# Patient Record
Sex: Female | Born: 1970 | Race: White | Hispanic: No | Marital: Married
Health system: Southern US, Community
[De-identification: ages and names within clinical notes are randomized; demographics above are authoritative.]

## PROBLEM LIST (undated history)

## (undated) DIAGNOSIS — M1990 Unspecified osteoarthritis, unspecified site: Secondary | ICD-10-CM

## (undated) DIAGNOSIS — Z87442 Personal history of urinary calculi: Secondary | ICD-10-CM

## (undated) DIAGNOSIS — E785 Hyperlipidemia, unspecified: Secondary | ICD-10-CM

## (undated) DIAGNOSIS — G35 Multiple sclerosis: Secondary | ICD-10-CM

## (undated) DIAGNOSIS — I639 Cerebral infarction, unspecified: Secondary | ICD-10-CM

## (undated) DIAGNOSIS — K219 Gastro-esophageal reflux disease without esophagitis: Secondary | ICD-10-CM

## (undated) DIAGNOSIS — G459 Transient cerebral ischemic attack, unspecified: Secondary | ICD-10-CM

## (undated) DIAGNOSIS — F419 Anxiety disorder, unspecified: Secondary | ICD-10-CM

## (undated) DIAGNOSIS — E7800 Pure hypercholesterolemia, unspecified: Secondary | ICD-10-CM

## (undated) DIAGNOSIS — F32A Depression, unspecified: Secondary | ICD-10-CM

## (undated) DIAGNOSIS — G43909 Migraine, unspecified, not intractable, without status migrainosus: Secondary | ICD-10-CM

## (undated) DIAGNOSIS — E119 Type 2 diabetes mellitus without complications: Secondary | ICD-10-CM

## (undated) HISTORY — PX: WISDOM TOOTH EXTRACTION: SHX21

## (undated) HISTORY — PX: UPPER GI ENDOSCOPY: SHX6162

## (undated) HISTORY — DX: Transient cerebral ischemic attack, unspecified: G45.9

## (undated) HISTORY — DX: Multiple sclerosis: G35

## (undated) HISTORY — DX: Anxiety disorder, unspecified: F41.9

## (undated) HISTORY — DX: Hyperlipidemia, unspecified: E78.5

## (undated) HISTORY — DX: Type 2 diabetes mellitus without complications: E11.9

## (undated) HISTORY — DX: Cerebral infarction, unspecified: I63.9

## (undated) HISTORY — DX: Migraine, unspecified, not intractable, without status migrainosus: G43.909

---

## 1998-01-01 ENCOUNTER — Inpatient Hospital Stay (HOSPITAL_COMMUNITY): Admission: AD | Admit: 1998-01-01 | Discharge: 1998-01-04 | Payer: Self-pay | Admitting: Obstetrics

## 1998-03-10 ENCOUNTER — Encounter: Admission: RE | Admit: 1998-03-10 | Discharge: 1998-06-08 | Payer: Self-pay | Admitting: Obstetrics

## 1998-10-17 HISTORY — PX: OTHER SURGICAL HISTORY: SHX169

## 2007-10-18 HISTORY — PX: ABDOMINAL HYSTERECTOMY: SHX81

## 2007-11-14 HISTORY — PX: COLONOSCOPY: SHX174

## 2008-01-31 ENCOUNTER — Ambulatory Visit (HOSPITAL_COMMUNITY): Admission: RE | Admit: 2008-01-31 | Discharge: 2008-01-31 | Payer: Self-pay | Admitting: Obstetrics and Gynecology

## 2008-01-31 ENCOUNTER — Encounter (INDEPENDENT_AMBULATORY_CARE_PROVIDER_SITE_OTHER): Payer: Self-pay | Admitting: Obstetrics and Gynecology

## 2008-04-08 ENCOUNTER — Encounter (INDEPENDENT_AMBULATORY_CARE_PROVIDER_SITE_OTHER): Payer: Self-pay | Admitting: Obstetrics and Gynecology

## 2008-04-08 ENCOUNTER — Inpatient Hospital Stay (HOSPITAL_COMMUNITY): Admission: RE | Admit: 2008-04-08 | Discharge: 2008-04-10 | Payer: Self-pay | Admitting: Obstetrics and Gynecology

## 2011-03-01 NOTE — H&P (Signed)
Joanne Reed, FREDMAN NO.:  [id number]   MEDICAL RECORD NO.:  [id number]          PATIENT TYPE:  AMB   LOCATION:  SDC                           FACILITY:  [facility]   PHYSICIAN:  Randye Lobo, M.D.   DATE OF BIRTH:  Mar 27, 1971   DATE OF ADMISSION:  DATE OF DISCHARGE:                              HISTORY & PHYSICAL   CHIEF COMPLAINT:  Heavy and irregular menses.   HISTORY OF PRESENT ILLNESS:  The patient is a 40 year old, gravida 4,  para 3-0-1-3, Caucasian female, status post bilateral tubal ligation and  status post hysteroscopy D&C on January 31, 2008 for menometrorrhagia, who  presents desiring definitive surgical treatment.  The patient has been  previously seen and evaluated for irregular vaginal bleeding and  dysmenorrhea, and she has undergone a normal pelvic ultrasound in the  office on December 13, 2007.  The patient at that time had an office  endometrial biopsy which documented benign secretory endometrium with  some degenerative changes.  The patient at that time desired an  endometrial ablation; she was therefore brought to the operating room on  January 31, 2008 at which time she had a hysteroscopy D&C and the  inability to perform an endometrial ablation due to cavity size and  shape.  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  The patient had been previously also told that she has some cervical  stenosis.  The endometrial ablation was not performed at that time due  to the above.  The patient's pathology report documented benign  endocervical and squamous mucosa.  There were minute glandular fragments  which were thought to be a possible endometrial origin.  The patient now  presents for definitive surgical evaluation and treatment of the  dysmenorrhea and the menometrorrhagia, and the plan is made to proceed  with a total abdominal hysterectomy after risks, benefits, and  alternatives to surgery are rereviewed.   The past  obstetric and gynecologic histories are significant for 3 prior  cesarean sections.  The patient is status post bilateral tubal ligation.  The patient is status post hysteroscopy D&C.  The patient's last Pap  smear was performed November 29, 2007 and was within normal limits.  The  patient does have a history of a benign left breast mass which she is  followed by her primary physician in [place], [place].   PAST MEDICAL HISTORY:  1. Diabetes mellitus.  2. Hyperlipidemia.  3. Anxiety.  4. Iron deficiency anemia.  5. Over weight.   The patient has been off of Adipex in preparation for her current  surgery.   PAST SURGICAL HISTORY:  1. Status post cesarean section x3.  2. Status post bilateral tubal ligation.  3. Status post hysteroscopy D&C.  4. Status post cholecystectomy.   MEDICATIONS:  1. Metformin HCl 500 mg p.o. daily.  2. Iron sulfate 325 mg p.o. daily.  3. Clonazepam 0.5 mg p.o. daily.  4. Sandostatin 20 mg p.o. daily.  5. Citalopram HBR 20 mg p.o. daily   ALLERGIES:  No known drug allergies.  SOCIAL HISTORY:  The patient has been married for 10 years.  She is a  Geophysicist/field seismologist.  She denies the use of tobacco, alcohol or illicit  drugs.   FAMILY HISTORY:  Is positive for aunts with ovarian cancer.  The  patient's mother has kidney and skin cancer.  Her father had throat and  lung cancer.   REVIEW OF SYSTEMS:  At the time of the patient's preoperative visit, she  is reporting some sore throat the day prior to her preoperative exam.  She denied any fever or cough.   PHYSICAL EXAMINATION:  Height is 5 feet 4 inches, weight 179 pounds,  blood pressure 108/68.  In general, the patient is a Caucasian white  female in no acute distress.  HEENT:  Normocephalic, atraumatic.  LUNGS:  Clear to auscultation bilaterally.  HEART:  S1/S2 with a regular rate and rhythm.  ABDOMEN:  There is a well-healed Pfannenstiel incision without evidence  of herniation.   There is no evidence of hepatosplenomegaly or  organomegaly.  PELVIC EXAMINATION:  Documents normal external genitalia and normal  urethra.  The cervix demonstrates no lesions by palpation.  The uterus  is small and anteverted.  No adnexal masses are appreciated.  Exam of the lower extremities displays discoloration of the legs which  is felt to be consistent with brawny edema.   IMPRESSION:  The patient is a 40 year old para 3 female, status post  bilateral tubal ligation and also status post hysteroscopy D&C for  menometrorrhagia, who presents now for definitive surgical treatment.  A  plan is made to proceed with a total abdominal hysterectomy.  Risks,  benefits, and alternatives have been discussed with the patient who  wishes to proceed.      Randye Lobo, M.D.  Electronically Signed     BES/MEDQ  D:  04/07/2008  T:  04/07/2008  Job:  [id number]

## 2011-03-01 NOTE — Op Note (Signed)
Joanne Reed, REITAN NO.:  [id number]   MEDICAL RECORD NO.:  [id number]          PATIENT TYPE:  AMB   LOCATION:  SDC                           [facility]   PHYSICIAN:  Randye Lobo, M.D.   DATE OF BIRTH:  [birth year]   DATE OF PROCEDURE:  01/31/2008  DATE OF DISCHARGE:                               OPERATIVE REPORT   PREOPERATIVE DIAGNOSIS:  Menometrorrhagia.   POSTOPERATIVE DIAGNOSIS:  Menometrorrhagia.   PROCEDURE:  Hysteroscopy, dilatation and curettage.   SURGEON:  Conley Simmonds, MD   ANESTHESIA:  General endotracheal, paracervical block.   IV FLUIDS:  1300 mL Ringer's lactate.   ESTIMATED BLOOD LOSS:  Minimal.   URINE OUTPUT:  150 mL   COMPLICATIONS:  None.   SPECIMENS:  Endometrial curettings were sent to pathology.   SORBITOL DEFICIT:  50 mL.   INDICATIONS FOR PROCEDURE:  The patient is a 40 year old gravida 4, para  18, 0-1-3 Caucasian female, status post bilateral tubal ligation, who  presented with menstruation two times per month occurring every other  month.  The patient was experiencing prolonged menses of up to 9 days of  duration.  She required pad changes every 2 hours with clotting.  The  patient has been taking iron due to anemia.  An ultrasound December 13, 2007 demonstrated a normal uterus with an endometrial stripe of 0.52 cm.  The ovaries were unremarkable.  An endometrial biopsy on December 13, 2007 documented secretory endometrium.  A plan is now made to proceed  with a hysteroscopy, D&C and endometrial ablation after risks, benefits,  and alternatives were reviewed.   FINDINGS:  Examination under anesthesia revealed a small anteverted  mobile uterus.  No adnexal masses were appreciated.   The D&C and hysteroscopy demonstrated a small uterine cavity measuring  5.5 cm in maximal length.  There appeared to be more development of the  right uterine horn than of the left uterine horn raising the question of  a possible  unicornuate uterus.  There was a small amount of thickening  of the endometrium.   DESCRIPTION OF PROCEDURE:  The patient was reidentified in the  preoperative hold area.  She was taken to the operating room where  general endotracheal anesthesia was induced.  She was placed in the  dorsal lithotomy position and the lower abdomen and vagina and perineum  were sterilely prepped.  The patient was catheterized of urine.  She was  then sterilely draped.   An exam under anesthesia was performed.   A speculum was placed inside the vagina and a single-tooth tenaculum was  placed on the anterior cervical lip.  A paracervical block was performed  with a total of 10 mL in standard fashion.  The length to the internal  os was measured at approximately 4 cm.  The uterus was then sounded to a  length of approximately 5.5 centimeters.  The cervical os was dilated  slightly with Pratt dilators to allow of the diagnostic hysteroscope.  The hysteroscope was inserted under the continuous infusion of sorbitol.  The  findings are as noted above.  Due to the small uterine cavity length  and concern for possible mullerian anomaly, the endometrial ablation was  not performed.  The cervix was then further dilated to a number 21 Pratt  dilator and a serrated curette was introduced into the uterine cavity  and endometrial curettings were obtained in all four quadrants.  The  specimen was sent to pathology.  The vaginal instruments were then  removed.  Hemostasis was good.   The patient's procedure was concluded.  There were no complications.  All needle, instrument, sponge counts were correct.  The patient is  escorted to the recovery room in stable and awake condition.      Randye Lobo, M.D.  Electronically Signed     BES/MEDQ  D:  01/31/2008  T:  01/31/2008  Job:  [id number]

## 2011-03-01 NOTE — Op Note (Signed)
NAMEJULLIANA, Joanne Reed NO.:  [id number]   MEDICAL RECORD NO.:  [id number]          PATIENT TYPE:  INP   LOCATION:  9309                          [facility]   PHYSICIAN:  Randye Lobo, M.D.   DATE OF BIRTH:  [birth year]   DATE OF PROCEDURE:  04/08/2008  DATE OF DISCHARGE:                               OPERATIVE REPORT   PREOPERATIVE DIAGNOSIS:  Menometrorrhagia.   POSTOPERATIVE DIAGNOSIS:  Menometrorrhagia.   PROCEDURE:  Total abdominal hysterectomy.   SURGEON:  Randye Lobo, MD.   ASSISTANTLuvenia Redden, MD.   ANESTHESIA:  General endotracheal.   IV FLUIDS:  2000 mL Ringer's lactate.   ESTIMATED BLOOD LOSS:  100 mL   URINE OUTPUT:  400 mL   COMPLICATIONS:  None.   INDICATIONS FOR THE PROCEDURE:  The patient is a 40 year old gravida 4,  para 3-0-1-3 Caucasian female, status post bilateral tubal ligation and  status post hysteroscopy D&C was performed on January 31, 2008, for  menometrorrhagia, who desires definitive surgical treatment.  The  patient is previously had a normal pelvic ultrasound.  She was taken to  the operating room on January 31, 2008, with the intention of doing a  NovaSure endometrial ablation.  However at the time, the uterine cavity  appeared to be extremely small and the endometrial ablation could,  therefore, not be performed.  The patient does have an obstetric history  significant for 3 prior cesarean sections.  A plan is now made to  proceed with a total abdominal hysterectomy after risks, benefits, and  alternatives are once again reviewed.   FINDINGS:  The uterus and ovaries appeared to be normal.  The fallopian  tubes were consistent with a prior bilateral tubal ligation.  The  patient had a very elongated cervix which measured at least 8 cm in  length.  There was no evidence of any endometriosis nor adhesive disease  in the pelvis.  There were some filmy adhesions which were present in  the right upper quadrant  consistent with the patient's prior  cholecystectomy.   SPECIMENS:  The uterus and cervix were sent to pathology.   PROCEDURE:  The patient was reidentified in the preoperative hold area.  She did receive Ancef 1 g IV for antibiotic prophylaxis.  The patient  received both TED hose and PAS stockings for DVT prophylaxis.  In the  operating room, general endotracheal anesthesia was induced.  The  patient's abdomen and vagina were sterilely prepped, and a Foley  catheter was placed inside the bladder.  She was sterilely draped.   A Pfannenstiel incision was created sharply with a scalpel along the  line of the patient's previous Pfannenstiel incision.  The incision was  carried down to the fascia using the scalpel.  The hemostasis in the  subcutaneous layer was created with monopolar cautery.  The fascia was  incised with the scalpel in the midline and the incision was extended  with the Mayo scissors bilaterally.  The rectus muscles were dissected  off of the fascia superiorly and inferiorly.  The rectus muscles were  sharply divided in the midline.  The parietal peritoneum was elevated  with 2 hemostat clamps and entered sharply with the Metzenbaum scissors.  The peritoneal incision was extended cranially and caudally using sharp  dissection.   A self-retaining retractor was placed inside the pelvis and lap pads  were used to pack the bowel into the upper abdomen.   Long Kelly clamps were placed across the adnexal structures bilaterally.  The right round ligament was grasped with the pickups and suture ligated  with a transfixing suture of 0 Vicryl.  The round ligament was then  cauterized and bisected with monopolar cautery.  The dissection was  carried through the anterior and posterior leaves of the broad ligament  using the same instrument.  A window was then created through the  posterior leaf of the broad ligament and the remnant of the proximal  fallopian tube and  utero-ovarian ligament were then clamped, sharply  divided, and suture ligated with a transfixing suture of 0 Vicryl,  followed by then a suture ligature of the same.  Hemostasis was good.  The bladder was taken down off of the anterior lower uterine segment  with a combination of sharp dissection and monopolar cautery.  The same  procedure that was performed on the right-hand side was then repeated on  the left-hand side.  There was some bleeding noted in the broad ligament  on the patient's left-hand side and this was cauterized by elevating  with a smooth pickups and then using monopolar cautery after the ureter  was noted to be out of the way of the surgical field.   Each of the uterine arteries were then clamped, sharply lysed, sharply  divided, and ligated with the suture of 0 Vicryl.  The bladder was  further dissected off of the cervix using monopolar cautery.  This was  performed without difficulty.  The remaining branches of the cardinal  ligaments were then clamped with straight Heaney clamps, sharply divided  with a scalpel, and suture ligated with 0 Vicryl bilaterally.  The  cervix was noted to be very long.  Each of the uterosacral ligaments  were clamped, sharply divided, and suture ligated with transfixing  sutures of 0 Vicryl.  This brought the dissection all the way down to  the level of the bottom of the cervix.  Curved Heaney clamps were then  placed across the top of the vagina bilaterally.  The specimen was then  sharply excised and sent to pathology.  Transfixing sutures of 0 Vicryl  were placed at the angles bilaterally and a single figure-of-eight  suture was placed in the midportion of the vaginal apex for its closure.   The pelvis was then irrigated and suctioned and examined and found to be  hemostatic.   The self-retaining retractor and the lap pads were removed from the  peritoneal cavity.   The abdomen was closed.  The parietal peritoneum was closed with  a  running suture of 2-0 Vicryl.  The fascia was closed with a running  suture of 0 Vicryl.  The subcutaneous layer was irrigated and suctioned  and made hemostatic with monopolar cautery.  The subcutaneous tissue was  undermined on the left inferior portion of the incision due to scarring  from her previous procedures.  Hemostasis was good.  The skin was,  therefore, closed with staples and a sterile bandage was placed of the  incision.   This concluded the patient's procedure.  There were  no complications.  All needle, instrument, and sponge counts were correct.  The patient is  escorted to the recovery room in stable and awake condition.      Randye Lobo, M.D.  Electronically Signed     BES/MEDQ  D:  04/08/2008  T:  04/08/2008  Job:  [id number]

## 2011-03-04 NOTE — Discharge Summary (Signed)
NAMEVALERIE, CONES NO.:  [id number]   MEDICAL RECORD NO.:  [id number]          PATIENT TYPE:  INP   LOCATION:  9309                          [facility]ILITY[facility]   PHYSICIAN:  Randye Lobo, M.D.   DATE OF BIRTH:  1971/07/16   DATE OF ADMISSION:  04/08/2008  DATE OF DISCHARGE:  04/10/2008                               DISCHARGE SUMMARY   ADMISSION DIAGNOSES:  1. Menometrorrhagia.  2. Adult-onset diabetes mellitus.   DISCHARGE DIAGNOSES:  1. Menometrorrhagia.  2. Adult-onset diabetes mellitus.  3. Status post total abdominal hysterectomy.   SIGNIFICANT OPERATIONS AND PROCEDURES:  The patient underwent a total  abdominal hysterectomy under the direction of Dr. Conley Simmonds and with  the assistance of Dr. Lodema Hong at the [facility] [place]  on April 08, 2008.   ADMISSION HISTORY AND PHYSICAL EXAMINATION:  The patient is a 40-year-  old, gravida 4, para 3-0-1-3, Caucasian female, status post bilateral  tubal ligation and status post hysteroscopy and D&C on January 31, 2008,  for menometrorrhagia, who desired a definitive surgical treatment.  The  patient in the office has previously had a normal pelvic ultrasound and  a benign endometrial biopsy.  An attempt was made to perform a NovaSure  endometrial ablation at the time of the patient's hysteroscopy and D&C  on January 31, 2008, however, this was not possible due to the unusual  cavity shape and size noted at that time.  The patient also was told she  had a history of some cervical stenosis.  The pathology report from the  [facility] demonstrated benign endocervical and squamous mucosa along with  minute fragments of what was thought to be endometrial cells.  The  patient does have a history of 3 prior cesarean sections.  She also has  a history of adult-onset diabetes mellitus, which was controlled with  metformin.  Please refer to her complete history and physical for  further medical history.   PHYSICAL  EXAMINATION:  The patient was noted to have a well-healed  Pfannenstiel incision without evidence of herniation.  There was no  evidence of hepatosplenomegaly or organomegaly.  Her pelvic exam  documented normal external genitalia and a normal urethra.  The cervix  demonstrated no lesions.  The uterus was noted to be small and  anteverted.  No adnexal masses were noted.   Exam of the lower extremities displayed brownish discoloration of the  anterior tibial surfaces consistent with brawny edema.   HOSPITAL COURSE:  The patient was admitted on April 08, 2008, at which  time she underwent a total abdominal hysterectomy performed under  general anesthesia.  The findings at surgery documented a normal-  appearing uterus and ovaries.  The fallopian tubes were consistent with  a prior tubal ligation and the patient was noted to have a very  elongated cervix measuring approximately 8 cm in length visually, which  was thought to explain the previous findings of a small uterine cavity  and difficulty with the NovaSure procedure.  There was no evidence of  endometriosis nor adhesive disease  in the abdomen or pelvis.  There was  some filmy adhesions present in the right upper quadrant consistent with  the patient's prior cholecystectomy.  The estimated blood loss from  surgery was 100 mL and there were no complications.   During the patient's postoperative course, she was placed on an insulin  drip to maintain adequate glucose control while she was not yet started  on a normal diet.  Her fingerstick blood glucose numbers ranged from 101  to approximately 165.  The patient was successfully started back on her  metformin on postoperative day #2 when she began tolerating a regular  diet.   The patient's pain was controlled postoperatively with a morphine PCA  and Toradol.  Eventually, she was converted over to Percocet and  ibuprofen, which controlled her pain well.   The patient began ambulating  independently on postoperative day #1.  She  did receive both TED hose and PAS stockings for DVT prophylaxis.   The patient's Foley catheter was removed on postop day #1 and she was  able to void spontaneously.   The patient's incision was intact and without any significant drainage  or erythema.   The patient was found to be in good condition and ready for discharge on  postoperative day #2.   Her final pathology report was pending at the time of her discharge, and  her discharge hemoglobin was noted to be 13.   DISCHARGE INSTRUCTIONS:  1. Discharge to home.  2. The patient will take the following medications:      a.     Percocet 5 mg/325 mg 1-2 p.o. q.4-6 h. p.r.n. pain.      b.     Ibuprofen 600 mg p.o. q.6 h. p.r.n. pain.      c.     The patient will resume her metformin and other medications       as previously directed.  3. The patient will have decreased activity for the next 6 weeks and      she will not drive for 2 weeks.  She will place nothing in the      vagina for 6 weeks.  4. The patient will follow her usual recommended diabetic diet.  5. The patient will follow up in the office in 1 day for staple      removal.  She will call if she experiences any problems with fever,      nausea and vomiting, pain uncontrolled by her medication, bleeding      or drainage from the incision, heavy vaginal bleeding, or any other      concerns.      Randye Lobo, M.D.  Electronically Signed     BES/MEDQ  D:  05/13/2008  T:  05/14/2008  Job:  [id number]

## 2011-07-12 LAB — COMPREHENSIVE METABOLIC PANEL
ALT: 15
AST: 17
Albumin: 3.6
Calcium: 8.9
GFR calc Af Amer: >60
Glucose, Bld: 134 — ABNORMAL HIGH
Potassium: 4
Sodium: 138
Total Protein: 6.4

## 2011-07-12 LAB — URINALYSIS, ROUTINE W REFLEX MICROSCOPIC
Bilirubin Urine: NEGATIVE
Ketones, ur: NEGATIVE
Nitrite: NEGATIVE
Urobilinogen, UA: 0.2

## 2011-07-12 LAB — CBC
MCHC: 34.1
Platelets: 329
RDW: 21.3 — ABNORMAL HIGH

## 2011-07-12 LAB — PREGNANCY, URINE: Preg Test, Ur: NEGATIVE

## 2011-07-14 LAB — COMPREHENSIVE METABOLIC PANEL
AST: 21
Albumin: 3.7
Alkaline Phosphatase: 64
Chloride: 106
Creatinine, Ser: 0.52
GFR calc Af Amer: >60
Potassium: 3.8
Total Bilirubin: 0.5
Total Protein: 6.4

## 2011-07-14 LAB — CBC
HCT: 38.7
Hemoglobin: 13
MCHC: 33.6
Platelets: 303
Platelets: 336
RDW: 13.1
RDW: 13.1
WBC: 7.7

## 2011-07-14 LAB — BASIC METABOLIC PANEL
BUN: 2 — ABNORMAL LOW
CO2: 29
GFR calc non Af Amer: >60
Glucose, Bld: 94
Potassium: 3.7

## 2011-07-14 LAB — PREGNANCY, URINE: Preg Test, Ur: NEGATIVE

## 2011-07-14 LAB — URINALYSIS, ROUTINE W REFLEX MICROSCOPIC
Leukocytes, UA: NEGATIVE
Nitrite: NEGATIVE
Specific Gravity, Urine: <1.005 — ABNORMAL LOW
pH: 6

## 2011-07-14 LAB — URINE MICROSCOPIC-ADD ON

## 2012-03-12 ENCOUNTER — Ambulatory Visit (INDEPENDENT_AMBULATORY_CARE_PROVIDER_SITE_OTHER): Payer: BC Managed Care – PPO | Admitting: Family Medicine

## 2012-03-12 VITALS — BP 122/78 | HR 65 | Temp 98.0°F | Resp 16 | Ht 64.0 in | Wt 182.8 lb

## 2012-03-12 DIAGNOSIS — T148XXA Other injury of unspecified body region, initial encounter: Secondary | ICD-10-CM

## 2012-03-12 DIAGNOSIS — E119 Type 2 diabetes mellitus without complications: Secondary | ICD-10-CM

## 2012-03-12 LAB — POCT CBC
Granulocyte percent: 60 %G (ref 37–80)
HCT, POC: 43 % (ref 37.7–47.9)
Hemoglobin: 13.7 g/dL (ref 12.2–16.2)
MCV: 90.4 fL (ref 80–97)
POC Granulocyte: 5.3 (ref 2–6.9)
POC LYMPH PERCENT: 33.4 %L (ref 10–50)
RBC: 4.76 M/uL (ref 4.04–5.48)
RDW, POC: 14.8 %

## 2012-03-12 LAB — POCT GLYCOSYLATED HEMOGLOBIN (HGB A1C): Hemoglobin A1C: 5.7

## 2012-03-12 MED ORDER — CEPHALEXIN 500 MG PO CAPS: 500.0000 mg | ORAL_CAPSULE | Freq: Three times a day (TID) | ORAL | Status: AC

## 2012-03-12 NOTE — Progress Notes (Signed)
Patient Name: Joanne Reed Date of Birth: 05-01-1971 Medical Record Number: [id number] Gender: female Date of Encounter: 03/12/2012  History of Present Illness:  Joanne Reed is a 41 y.o. very pleasant female patient who presents with the following:  She noted a bruise on her right ankle 2 days ago, with a red ?bite in the center.  She does not recall any injury.   She does note that she feels nauseated and has had a HA for a day or so. No vomiting or fever.  These symptoms are not severe but she wonders if they could be related to a spider bite.  She does have DM but is not on insulin- her last A1c was about 4 months ago and was good, but she is not sure of the exact number.    There is no problem list on file for this patient.  No past medical history on file. No past surgical history on file. History  Substance Use Topics  . Smoking status: Former Games developer  . Smokeless tobacco: Not on file  . Alcohol Use: Not on file   No family history on file. No Known Allergies  Medication list has been reviewed and updated.  Review of Systems: As per HPI- otherwise negative.   Physical Examination: Filed Vitals:   03/12/12 1126  BP: 122/78  Pulse: 65  Temp: 98 F (36.7 C)  TempSrc: Oral  Resp: 16  Height: 5\' 4"  (1.626 m)  Weight: 182 lb 12.8 oz (82.918 kg)    Body mass index is 31.38 kg/(m^2).  GEN: WDWN, NAD, Non-toxic, A & O x 3, overweight HEENT: Atraumatic, Normocephalic. Neck supple. No masses, No LAD.  Tm and oropharynx wnl Ears and Nose: No external deformity. CV: RRR, No M/G/R. No JVD. No thrill. No extra heart sounds. PULM: CTA B, no wheezes, crackles, rhonchi. No retractions. No resp. distress. No accessory muscle use. EXTR: No c/c/e. There is a bruise on the medial right lower calf- it is about 2inches in diameter and round.  There is no redness, swelling, or wound associated.  The ankle and foot are otherwise normal and have normal ROM, no tenderness with ROM NEURO  Normal gait.  PSYCH: Normally interactive. Conversant. Not depressed or anxious appearing.  Calm demeanor.    Results for orders placed in visit on 03/12/12  POCT CBC      Component Value Range   WBC 8.8  4.6 - 10.2 (K/uL)   Lymph, poc 2.9  0.6 - 3.4    POC LYMPH PERCENT 33.4  10 - 50 (%L)   MID (cbc) 0.6  0 - 0.9    POC MID % 6.6  0 - 12 (%M)   POC Granulocyte 5.3  2 - 6.9    Granulocyte percent 60.0  37 - 80 (%G)   RBC 4.76  4.04 - 5.48 (M/uL)   Hemoglobin 13.7  12.2 - 16.2 (g/dL)   HCT, POC 40.9  81.1 - 47.9 (%)   MCV 90.4  80 - 97 (fL)   MCH, POC 28.8  27 - 31.2 (pg)   MCHC 31.9  31.8 - 35.4 (g/dL)   RDW, POC 91.4     Platelet Count, POC 430 (*) 142 - 424 (K/uL)   MPV 8.0  0 - 99.8 (fL)  POCT GLYCOSYLATED HEMOGLOBIN (HGB A1C)      Component Value Range   Hemoglobin A1C 5.7       Assessment and Plan: 1. Bruise  POCT CBC, cephALEXin (KEFLEX)  500 MG capsule  2. DM (diabetes mellitus)  POCT glycosylated hemoglobin (Hb A1C)   Suspect that she has suffered a rupture of a superficial vessel resulting in a bruise.  Do not see any sign of infection or other wound.  Did give rx for keflex to hold, but she will not start it unless she notes redness, swelling or heat.  She actually will see her PCP tomorrow and will recheck then.   A1c is excellent!

## 2013-09-20 ENCOUNTER — Encounter: Payer: Self-pay | Admitting: Obstetrics and Gynecology

## 2013-09-20 ENCOUNTER — Ambulatory Visit (INDEPENDENT_AMBULATORY_CARE_PROVIDER_SITE_OTHER): Payer: BC Managed Care – PPO | Admitting: Obstetrics and Gynecology

## 2013-09-20 VITALS — BP 104/70 | HR 80 | Resp 16 | Ht 64.0 in | Wt 184.0 lb

## 2013-09-20 DIAGNOSIS — E010 Iodine-deficiency related diffuse (endemic) goiter: Secondary | ICD-10-CM

## 2013-09-20 DIAGNOSIS — E049 Nontoxic goiter, unspecified: Secondary | ICD-10-CM

## 2013-09-20 DIAGNOSIS — Z01419 Encounter for gynecological examination (general) (routine) without abnormal findings: Secondary | ICD-10-CM

## 2013-09-20 DIAGNOSIS — Z0000 Encounter for general adult medical examination without abnormal findings: Secondary | ICD-10-CM

## 2013-09-20 HISTORY — DX: Iodine-deficiency related diffuse (endemic) goiter: E01.0

## 2013-09-20 LAB — POCT URINALYSIS DIPSTICK
Bilirubin, UA: NEGATIVE
Glucose, UA: NEGATIVE
Ketones, UA: NEGATIVE
Leukocytes, UA: NEGATIVE
Nitrite, UA: NEGATIVE
pH, UA: 5

## 2013-09-20 LAB — THYROID PANEL WITH TSH
Free Thyroxine Index: 3.6 (ref 1.0–3.9)
T4, Total: 12.5 ug/dL (ref 5.0–12.5)
TSH: 1.966 u[IU]/mL (ref 0.350–4.500)

## 2013-09-20 NOTE — Progress Notes (Signed)
Patient ID: Joanne Reed, female   DOB: [birth year], 42 y.o.   MRN: [id number] GYNECOLOGY VISIT  PCP:   Cheri Rous, MD  Referring provider:   HPI: 42 y.o.   Widowed  Caucasian  female   [phone] with No LMP recorded. Patient has had a hysterectomy.   here for   AEX. Losing weight intentionally.  Lost 40 pounds.  Feels hot all the time.  This is not new.   Hgb:    PCP Urine:  Neg  GYNECOLOGIC HISTORY: No LMP recorded. Patient has had a hysterectomy.  Still has ovaries.  Sexually active:  yes Partner preference: female Contraception:   hysterectomy Menopausal hormone therapy: no DES exposure: no   Blood transfusions:   no Sexually transmitted diseases:   no GYN Procedures:  R-TLH 2009, C-Section x3 Mammogram:    2011 wnl: [facility]             Pap:   2010 wnl History of abnormal pap smear:  no   OB History   Grav Para Term Preterm Abortions TAB SAB Ect Mult Living   4 3 3  1  1   3        LIFESTYLE: Exercise:   walking            Tobacco:   Former smoker--quit 15 years ago Alcohol:     no Drug use:  no  OTHER HEALTH MAINTENANCE: Tetanus/TDap:   2008 Gardisil:              n/a Influenza:            07/2013 Zostavax:             n/a  Bone density:      n/a Colonoscopy:      2010 Dr. Chales Abrahams in [place]:polyps.  Pt. Not sure when next colonoscopy due but probably in 2015.  Cholesterol check:   07/2013 wnl on medications.  Family History  Problem Relation Age of Onset  . Cancer Mother     kidney cancer  . Esophageal cancer Father   . Hypertension Brother   . Diabetes Paternal Grandmother     There are no active problems to display for this patient.  Past Medical History  Diagnosis Date  . Anxiety   . Diabetes mellitus without complication   . Hyperlipidemia   . Migraine     Past Surgical History  Procedure Laterality Date  . Abdominal hysterectomy  2009    R-TLH--Dr. Edward Jolly  . Cholecystectomy    . Cesarean section  1987, 1990, 1999     ALLERGIES: Review of patient's allergies indicates no known allergies.  Current Outpatient Prescriptions  Medication Sig Dispense Refill  . citalopram (CELEXA) 10 MG tablet Take 10 mg by mouth daily.      . clonazePAM (KLONOPIN) 0.5 MG tablet Take 0.5 mg by mouth 3 (three) times daily as needed.      Marland Kitchen glipiZIDE (GLUCOTROL) 5 MG tablet Take 5 mg by mouth 2 (two) times daily before a meal.      . metFORMIN (GLUCOPHAGE) 1000 MG tablet Take 1,000 mg by mouth 2 (two) times daily with a meal.      . phentermine 37.5 MG capsule Take 37.5 mg by mouth every morning.      . simvastatin (ZOCOR) 10 MG tablet Take 10 mg by mouth at bedtime.      . topiramate (TOPAMAX) 25 MG tablet Take 1 tablet by mouth at bedtime.  No current facility-administered medications for this visit.     ROS:  Pertinent items are noted in HPI.  SOCIAL HISTORY:  husband deceased of esophageal cancer.  Works for school system.  PHYSICAL EXAMINATION:    BP 104/70  Pulse 80  Resp 16  Ht 5\' 4"  (1.626 m)  Wt 184 lb (83.462 kg)  BMI 31.57 kg/m2   Wt Readings from Last 3 Encounters:  09/20/13 184 lb (83.462 kg)  03/12/12 182 lb 12.8 oz (82.918 kg)     Ht Readings from Last 3 Encounters:  09/20/13 5\' 4"  (1.626 m)  03/12/12 5\' 4"  (1.626 m)    General appearance: alert, cooperative and appears stated age Head: Normocephalic, without obvious abnormality, atraumatic Neck: no adenopathy, supple, symmetrical, trachea midline and thyroid not enlarged, symmetric, no tenderness/mass/nodules Lungs: clear to auscultation bilaterally Breasts: Inspection negative, No nipple retraction or dimpling, No nipple discharge or bleeding, No axillary or supraclavicular adenopathy, Normal to palpation without dominant masses Heart: regular rate and rhythm Abdomen: soft, non-tender; no masses,  no organomegaly Extremities: extremities normal, atraumatic, no cyanosis or edema Skin: Skin color, texture, turgor normal. Brawny skin  changes over anterior tibial surfaces bilaterally.  Lymph nodes: Cervical, supraclavicular, and axillary nodes normal. No abnormal inguinal nodes palpated Neurologic: Grossly normal  Pelvic: External genitalia:  no lesions              Urethra:  normal appearing urethra with no masses, tenderness or lesions              Bartholins and Skenes: normal                 Vagina: normal appearing vagina with normal color and discharge, no lesions              Cervix: absent              Pap and high risk HPV testing done: no.            Bimanual Exam:  Uterus:   absent                                      Adnexa: normal adnexa in size, nontender and no masses                                      Rectovaginal: Confirms                                      Anus:  normal sphincter tone, no lesions  ASSESSMENT  Normal gynecologic exam. Right thyroid nodule?  Thyroid enlargement.   PLAN  Mammogram due.  Pt will call to schedule at [facility]. Pap smear and high risk HPV testing not indicated. Thyroid panel Thyroid ultrasound ordered. Medications per Epic orders Return annually or prn   An After Visit Summary was printed and given to the patient.

## 2013-09-20 NOTE — Patient Instructions (Signed)

## 2013-09-23 ENCOUNTER — Telehealth: Payer: Self-pay | Admitting: Obstetrics and Gynecology

## 2013-09-23 DIAGNOSIS — E041 Nontoxic single thyroid nodule: Secondary | ICD-10-CM

## 2013-09-23 NOTE — Telephone Encounter (Signed)
Pt wanting to know since bloodwork came back fine, should she still schedule an ultrasound appointment?

## 2013-09-24 NOTE — Telephone Encounter (Signed)
Appointment for U/S scheduled for 12/15 at 3:45. Patient aggreeable to time//date. Would like me to email her via mychart the location.Will activate mychart this evening and then I will email.  Per U/S scheduler, no prior authorization required.

## 2013-09-24 NOTE — Telephone Encounter (Signed)
Dr. Edward Jolly noted tyroid nodule on exam and ordered U/S. When patient returns call, will advise to keep u/s appointment.  Message left to return call to [place] at [phone].

## 2013-09-24 NOTE — Telephone Encounter (Signed)
Joanne Reed,  Thank you for your assistance and follow through with this patient!  [employer]

## 2013-09-25 NOTE — Telephone Encounter (Signed)
Cannot send [employer] as of today.   Appointment location [address]. Message left to return call to [place] at [phone].

## 2013-09-26 ENCOUNTER — Other Ambulatory Visit: Payer: Self-pay | Admitting: Obstetrics and Gynecology

## 2013-09-26 DIAGNOSIS — Z1231 Encounter for screening mammogram for malignant neoplasm of breast: Secondary | ICD-10-CM

## 2013-09-30 ENCOUNTER — Ambulatory Visit
Admission: RE | Admit: 2013-09-30 | Discharge: 2013-09-30 | Disposition: A | Discharge status: Home / Self Care | Payer: BC Managed Care – PPO | Source: Ambulatory Visit | Attending: Obstetrics and Gynecology | Admitting: Obstetrics and Gynecology

## 2013-09-30 DIAGNOSIS — E010 Iodine-deficiency related diffuse (endemic) goiter: Secondary | ICD-10-CM

## 2013-10-04 NOTE — Telephone Encounter (Signed)
Message copied by Joeseph Amor on Fri Oct 04, 2013 10:18 AM ------      Message from: Jerene Bears      Created: Tue Oct 01, 2013  9:12 AM       Please inform thyroid u/s showed a 4cm nodule.  Needs to be biopsied to make sure benign.  Please schedule at [facility]. ------

## 2013-10-04 NOTE — Telephone Encounter (Signed)
Spoke with patient and message from Dr. Hyacinth Meeker given. Okay to schedule thryoid biopsy.   Scheduled at [address] for thyroid biopsy.  January 8 with check in at 4:15 with 4:30 appointment. Left patient detailed message with instructions, okay with patient to leave detailed message.

## 2013-10-21 ENCOUNTER — Ambulatory Visit
Admission: RE | Admit: 2013-10-21 | Discharge: 2013-10-21 | Disposition: A | Discharge status: Home / Self Care | Payer: BC Managed Care – PPO | Source: Ambulatory Visit | Attending: Obstetrics and Gynecology | Admitting: Obstetrics and Gynecology

## 2013-10-21 DIAGNOSIS — Z1231 Encounter for screening mammogram for malignant neoplasm of breast: Secondary | ICD-10-CM

## 2013-10-24 ENCOUNTER — Ambulatory Visit
Admission: RE | Admit: 2013-10-24 | Discharge: 2013-10-24 | Disposition: A | Discharge status: Home / Self Care | Payer: BC Managed Care – PPO | Source: Ambulatory Visit | Attending: Obstetrics and Gynecology | Admitting: Obstetrics and Gynecology

## 2013-10-24 ENCOUNTER — Other Ambulatory Visit (HOSPITAL_COMMUNITY)
Admission: RE | Admit: 2013-10-24 | Discharge: 2013-10-24 | Disposition: A | Discharge status: Home / Self Care | Payer: BC Managed Care – PPO | Source: Ambulatory Visit | Attending: Interventional Radiology | Admitting: Interventional Radiology

## 2013-10-24 DIAGNOSIS — E041 Nontoxic single thyroid nodule: Secondary | ICD-10-CM

## 2013-10-25 ENCOUNTER — Telehealth: Payer: Self-pay | Admitting: Obstetrics and Gynecology

## 2013-10-25 DIAGNOSIS — E041 Nontoxic single thyroid nodule: Secondary | ICD-10-CM

## 2013-10-25 NOTE — Telephone Encounter (Signed)
Phone call to patient to discuss result of thyroid biopsy showing thyroid hyperplasia.  I will have the patient see Endocrinology for further management and follow up.  She is in agreement.

## 2013-10-29 ENCOUNTER — Telehealth: Payer: Self-pay | Admitting: Emergency Medicine

## 2013-10-29 NOTE — Telephone Encounter (Signed)
Patient has appointment scheduled with Dr. Chalmers Cater on 11/28/13 at 0900.  Message left to return call to [place] at [phone].

## 2013-10-29 NOTE — Telephone Encounter (Signed)
Message copied by Michele Mcalpine on Tue Oct 29, 2013 11:39 AM ------      Message from: [place], BROOK E      Created: Fri Oct 25, 2013  5:19 PM       FYI            I have placed a referral for Endocrinology consultation.   Biopsy showed thyroid hyperplasia. ------

## 2013-11-05 NOTE — Telephone Encounter (Signed)
Detailed message left for patient with date of appointment/location/time. Advised if needs to change appointment, please call Dr. Almetta Lovely office directly at [phone].

## 2013-11-26 NOTE — Telephone Encounter (Signed)
Please keep patient in a "hold list" to be sure that she follows through.

## 2013-11-26 NOTE — Telephone Encounter (Signed)
Spoke with patient. She cannot keep appointment as scheduled. She will call to reschedule appointment with Dr. Chalmers Cater.

## 2014-01-06 NOTE — Telephone Encounter (Signed)
Message left to return call to [place] at [phone].   Patient cancelled appointment with Dr. Chalmers Cater as scheduled. Did not reschedule.

## 2014-03-12 ENCOUNTER — Encounter: Payer: Self-pay | Admitting: Emergency Medicine

## 2014-03-12 NOTE — Telephone Encounter (Signed)
Please have a letter drafted that recommends that the patient follow up with her PCP regarding the thyroid hyperplastic nodule as she has not followed up with the endocrinologist.

## 2014-03-12 NOTE — Telephone Encounter (Signed)
Dr. Quincy Simmonds,  I have had this patient in holds. I called Dr. Almetta Lovely office and has r/s her initial appointment with Dr. Chalmers Cater.  She has not returned prior calls. How would you like to proceed?

## 2014-03-13 NOTE — Telephone Encounter (Signed)
Letter signed by Dr. Quincy Simmonds and mailed to patient address of record.

## 2014-06-27 ENCOUNTER — Encounter: Payer: Self-pay | Admitting: Obstetrics and Gynecology

## 2014-08-18 ENCOUNTER — Encounter: Payer: Self-pay | Admitting: Obstetrics and Gynecology

## 2014-09-26 ENCOUNTER — Ambulatory Visit: Payer: BC Managed Care – PPO | Admitting: Obstetrics and Gynecology

## 2014-09-26 ENCOUNTER — Ambulatory Visit (INDEPENDENT_AMBULATORY_CARE_PROVIDER_SITE_OTHER): Payer: BC Managed Care – PPO | Admitting: Obstetrics and Gynecology

## 2014-09-26 VITALS — BP 118/68 | HR 90 | Resp 18 | Ht 63.0 in | Wt 211.0 lb

## 2014-09-26 DIAGNOSIS — E041 Nontoxic single thyroid nodule: Secondary | ICD-10-CM

## 2014-09-26 DIAGNOSIS — Z01419 Encounter for gynecological examination (general) (routine) without abnormal findings: Secondary | ICD-10-CM

## 2014-09-26 DIAGNOSIS — R319 Hematuria, unspecified: Secondary | ICD-10-CM

## 2014-09-26 DIAGNOSIS — Z0000 Encounter for general adult medical examination without abnormal findings: Secondary | ICD-10-CM

## 2014-09-26 DIAGNOSIS — B3731 Acute candidiasis of vulva and vagina: Secondary | ICD-10-CM

## 2014-09-26 DIAGNOSIS — B373 Candidiasis of vulva and vagina: Secondary | ICD-10-CM

## 2014-09-26 DIAGNOSIS — Z113 Encounter for screening for infections with a predominantly sexual mode of transmission: Secondary | ICD-10-CM

## 2014-09-26 LAB — THYROID PANEL WITH TSH
FREE THYROXINE INDEX: 2.1 (ref 1.4–3.8)
T3 UPTAKE: 22 % (ref 22–35)
T4, Total: 9.5 ug/dL (ref 4.5–12.0)
TSH: 2.305 u[IU]/mL (ref 0.350–4.500)

## 2014-09-26 LAB — POCT URINALYSIS DIPSTICK
Urobilinogen, UA: NEGATIVE
pH, UA: 5

## 2014-09-26 MED ORDER — FLUCONAZOLE 150 MG PO TABS: 150.0000 mg | ORAL_TABLET | Freq: Once | ORAL | Status: DC

## 2014-09-26 MED ORDER — NITROFURANTOIN MONOHYD MACRO 100 MG PO CAPS: 100.0000 mg | ORAL_CAPSULE | Freq: Two times a day (BID) | ORAL | Status: DC

## 2014-09-26 MED ORDER — NYSTATIN-TRIAMCINOLONE 100000-0.1 UNIT/GM-% EX CREA: 1.0000 "application " | TOPICAL_CREAM | Freq: Two times a day (BID) | CUTANEOUS | Status: DC

## 2014-09-26 NOTE — Progress Notes (Signed)
43 y.o. [id number] Widowed CaucasianF here for annual exam.    Tried Monistat and no resolution of vaginal itching and burning.  Having external bleeding.  Having some discharge as well.   Sometimes has pain and burning with urination.   In new relationship.   Sexually active.  Had not been active in months due to partner's serious health problems.   Did not see Dr. Suzette Battiest for evaluation of thyroid nodule.  No blood work draw here today yet. Sees PCP for usual labs but patient is uncertain if thyroid was checked.   No LMP recorded. Patient has had a hysterectomy.          Sexually active: Yes.    The current method of family planning is status post hysterectomy.   Ovaries remain.  Exercising: Yes.    walking 3-4x/wk Smoker:  no  Health Maintenance: Pap:  2010- WNL History of abnormal Pap:  no MMG: 10/22/13 Bi-Rads 1: Negative  Colonoscopy:  2008- Polyps f/u not sure per patient BMD:   Never had one  TDaP:  Within 10 years  Screening Labs: Cecille Amsterdam, Urine:  Glucose ++, Trace RBC's , Leuks ++    reports that she has quit smoking. She does not have any smokeless tobacco history on file. She reports that she does not drink alcohol or use illicit drugs.  Past Medical History  Diagnosis Date  . Anxiety   . Diabetes mellitus without complication   . Hyperlipidemia   . Migraine     Past Surgical History  Procedure Laterality Date  . Abdominal hysterectomy  2009    R-TLH--Dr. Quincy Simmonds  . Cholecystectomy    . Cesarean section  1987, 1990, 1999    Current Outpatient Prescriptions  Medication Sig Dispense Refill  . citalopram (CELEXA) 10 MG tablet Take 10 mg by mouth daily.    . clonazePAM (KLONOPIN) 0.5 MG tablet Take 0.5 mg by mouth 3 (three) times daily as needed.    Marland Kitchen glipiZIDE (GLUCOTROL) 5 MG tablet Take 5 mg by mouth 2 (two) times daily before a meal.    . metFORMIN (GLUCOPHAGE) 1000 MG tablet Take 1,000 mg by mouth 2 (two) times daily with a meal.    . phentermine 37.5  MG capsule Take 37.5 mg by mouth every morning.    . simvastatin (ZOCOR) 10 MG tablet Take 10 mg by mouth at bedtime.     No current facility-administered medications for this visit.    Family History  Problem Relation Age of Onset  . Cancer Mother     kidney cancer  . Esophageal cancer Father   . Hypertension Brother   . Diabetes Paternal Grandmother     ROS:  Pertinent items are noted in HPI.  Otherwise, a comprehensive ROS was negative.  Exam:   BP 118/68 mmHg  Pulse 90  Resp 18  Wt 211 lb (95.709 kg)         Ht Readings from Last 3 Encounters:  09/20/13 5\' 4"  (1.626 m)  03/12/12 5\' 4"  (1.626 m)    General appearance: alert, cooperative and appears stated age Head: Normocephalic, without obvious abnormality, atraumatic Neck: no adenopathy, supple, symmetrical, trachea midline and thyroid right thyroid nodule 2 cm, nontender. Lungs: clear to auscultation bilaterally Breasts: normal appearance, no masses or tenderness, Inspection negative, No nipple retraction or dimpling, No nipple discharge or bleeding, No axillary or supraclavicular adenopathy Heart: regular rate and rhythm Abdomen: Pfannenstiel incision, soft, non-tender; bowel sounds normal; no masses,  no organomegaly  Extremities: extremities normal, atraumatic, no cyanosis or edema Skin: Skin color, texture, turgor normal. No rashes or lesions Lymph nodes: Cervical, supraclavicular, and axillary nodes normal. No abnormal inguinal nodes palpated Neurologic: Grossly normal   Pelvic: External genitalia: erythema of labia majora.              Urethra:  normal appearing urethra with no masses, tenderness or lesions              Bartholins and Skenes: normal                 Vagina: normal appearing vagina with normal color and discharge, no lesions              Cervix: absent              Pap taken: No. Bimanual Exam:  Uterus:  uterus absent              Adnexa: normal adnexa and no mass, fullness, tenderness                Rectovaginal: Confirms               Anus:  normal sphincter tone, no lesions  Wet prep - pH 4.5, negative yeast, clue cells and trichomonas.   A:  Well Woman visit.  Status post TAH.  Candidal vulvovaginitis.  Diabetes mellitus.  Hematuria.  Suspect UTI. New sexual relationship.  Thyroid nodule.  No follow up.  P:   Mammogram yearly. pap smear not indicated. Diflucan 150 mg.  See orders. Mycolog II.  See orders.  Urine micro and culture.  Macrobid 100 mg.  See orders. TFTs and STD testing.   Referral back to Dr. Suzette Battiest. Patient needs to follow through.  Return in one year and prn.   return annually or prn  An After Visit Summary was printed and given to the patient.

## 2014-09-27 LAB — HEPATITIS C ANTIBODY: HCV Ab: NEGATIVE

## 2014-09-27 LAB — URINALYSIS, MICROSCOPIC ONLY
BACTERIA UA: NONE SEEN
CRYSTALS: NONE SEEN
Casts: NONE SEEN
Squamous Epithelial / LPF: NONE SEEN

## 2014-09-27 LAB — STD PANEL
HEP B S AG: NEGATIVE
HIV 1&2 Ab, 4th Generation: NONREACTIVE

## 2014-09-27 LAB — GC/CHLAMYDIA PROBE AMP, URINE
Chlamydia, Swab/Urine, PCR: NEGATIVE
GC Probe Amp, Urine: NEGATIVE

## 2014-09-28 ENCOUNTER — Encounter: Payer: Self-pay | Admitting: Obstetrics and Gynecology

## 2014-09-28 LAB — URINE CULTURE

## 2014-09-28 MED ORDER — AMPICILLIN 250 MG PO CAPS: 250.0000 mg | ORAL_CAPSULE | Freq: Four times a day (QID) | ORAL | Status: DC

## 2014-09-28 NOTE — Addendum Note (Signed)
Addended by: Tacy Learn, BROOK E on: 09/28/2014 04:49 PM   Modules accepted: Orders, Medications

## 2014-09-30 ENCOUNTER — Telehealth: Payer: Self-pay | Admitting: Obstetrics and Gynecology

## 2014-09-30 NOTE — Telephone Encounter (Signed)
Left message for patient advising that she is scheduled with Dr Chalmers Cater 02.04.2016 @ 0930. Also left contact info for Dr Porfirio Oar office. Patient can contact them directly to see if they have any cancellations so that she can be seen sooner.

## 2014-11-20 ENCOUNTER — Other Ambulatory Visit: Payer: Self-pay | Admitting: Endocrinology

## 2014-11-20 DIAGNOSIS — E049 Nontoxic goiter, unspecified: Secondary | ICD-10-CM

## 2014-11-24 ENCOUNTER — Ambulatory Visit
Admission: RE | Admit: 2014-11-24 | Discharge: 2014-11-24 | Disposition: A | Discharge status: Home / Self Care | Payer: BC Managed Care – PPO | Source: Ambulatory Visit | Attending: Endocrinology | Admitting: Endocrinology

## 2014-11-24 DIAGNOSIS — E049 Nontoxic goiter, unspecified: Secondary | ICD-10-CM

## 2015-06-23 IMAGING — US US SOFT TISSUE HEAD/NECK
1 series · 13 of 25 positions shown · non-contrast
Comparison: None.

CLINICAL DATA: Palpable enlargement of the right side of the
thyroid gland.

EXAM:
THYROID ULTRASOUND
TECHNIQUE: Ultrasound examination of the thyroid gland and adjacent soft
tissues was performed.

[Series 1: us soft tissue head/neck · 0.08mm/px · 13 of 43 slices shown]
[im 1/43]
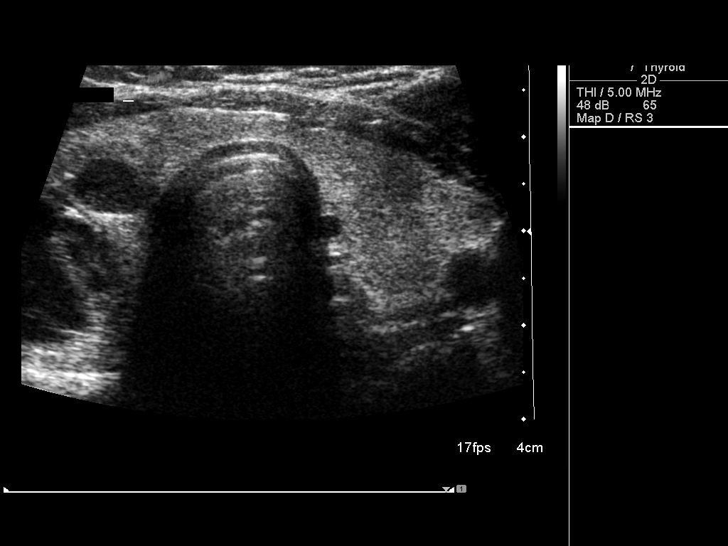
[im 4/43]
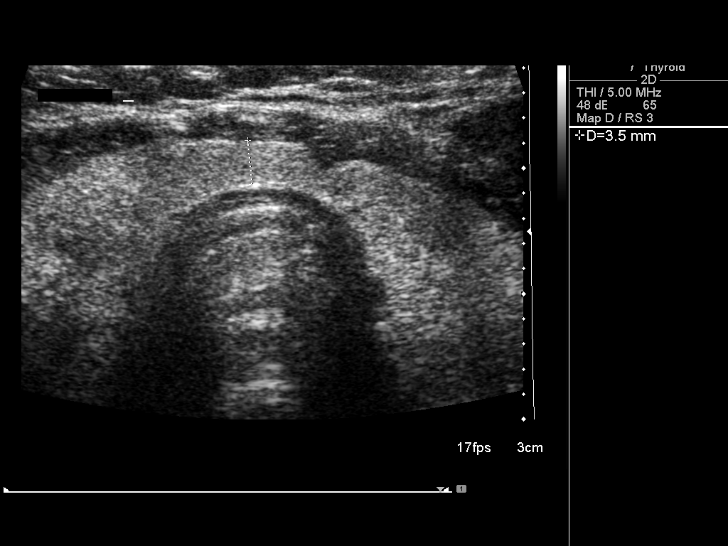
[im 8/43]
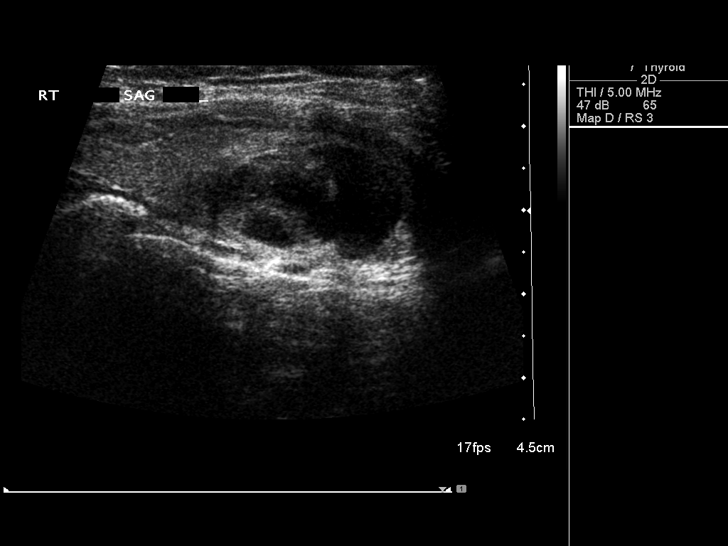
[im 11/43]
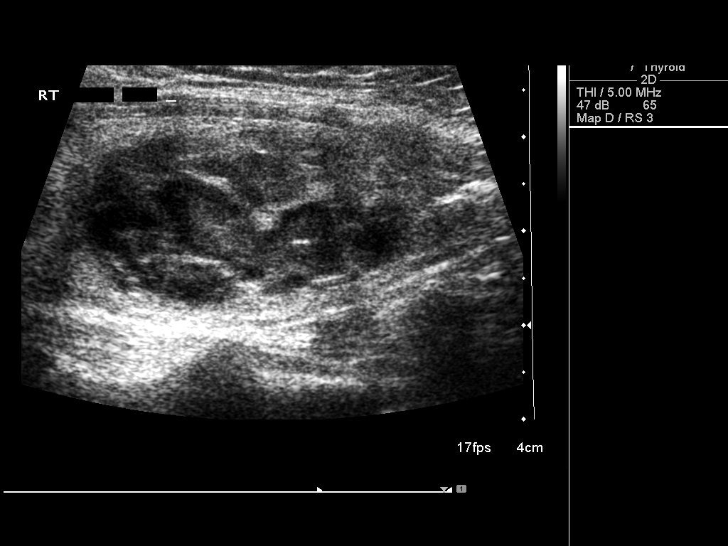
[im 15/43]
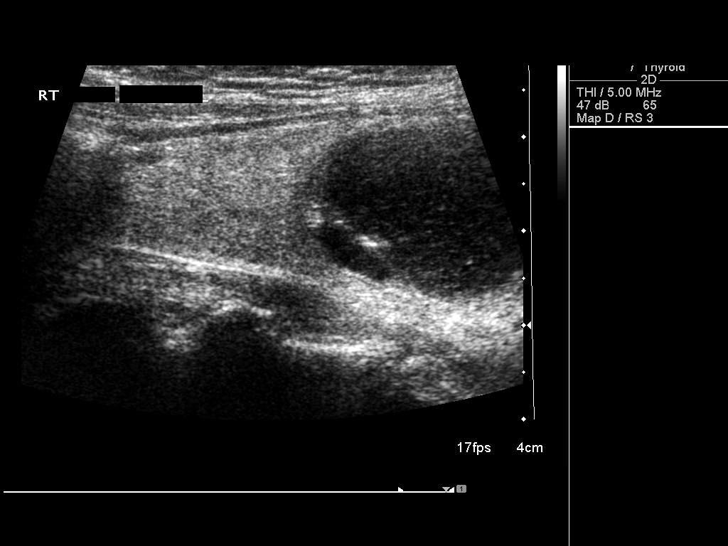
[im 18/43]
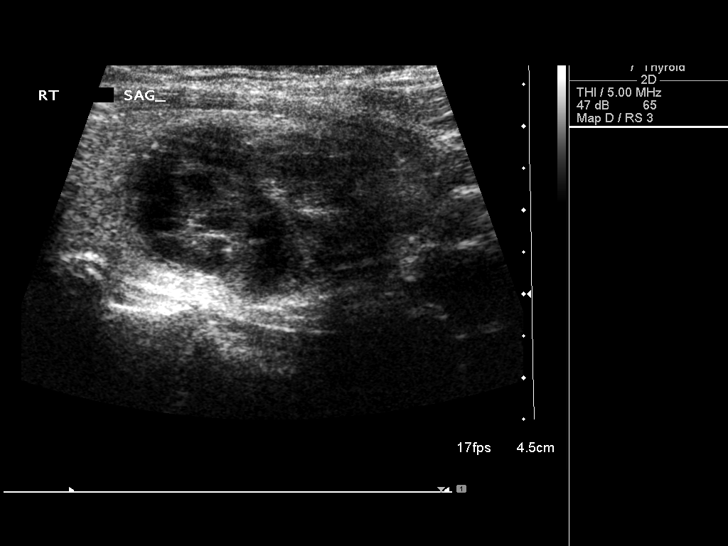
[im 22/43]
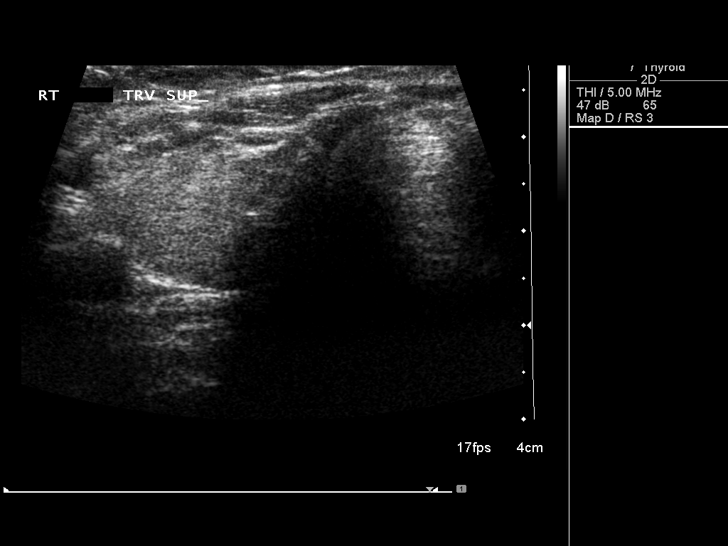
[im 25/43]
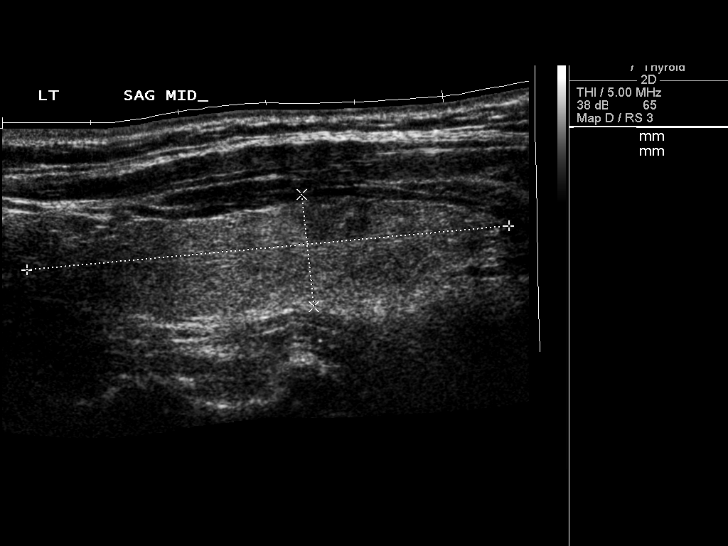
[im 29/43]
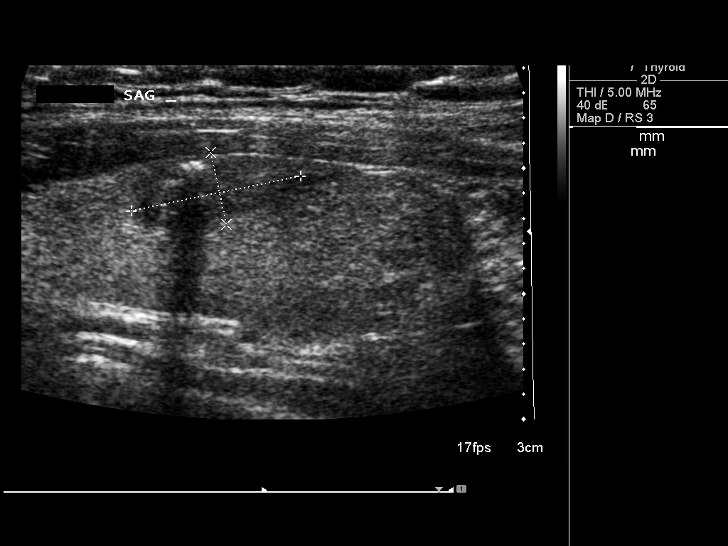
[im 32/43]
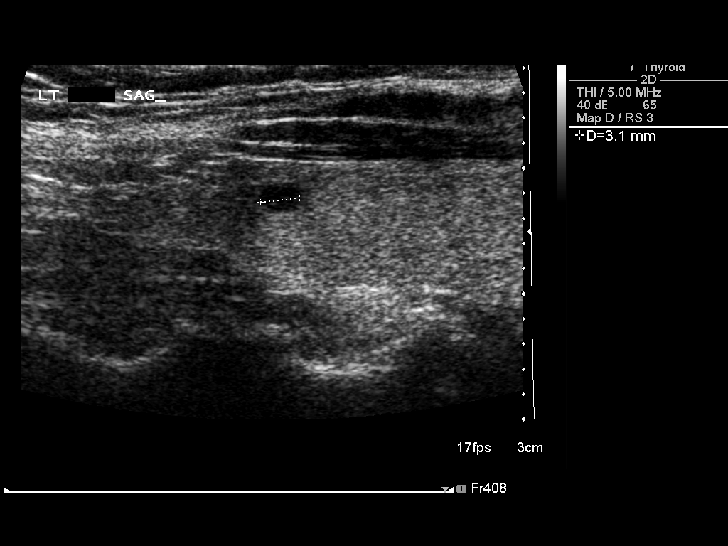
[im 36/43]
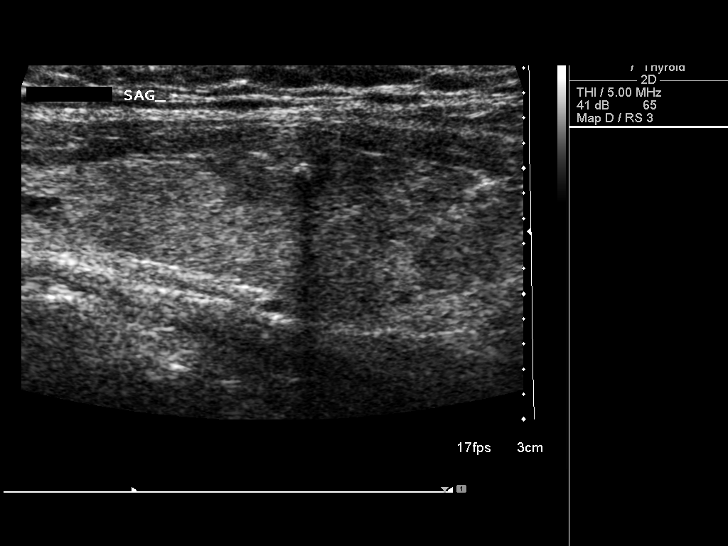
[im 39/43]
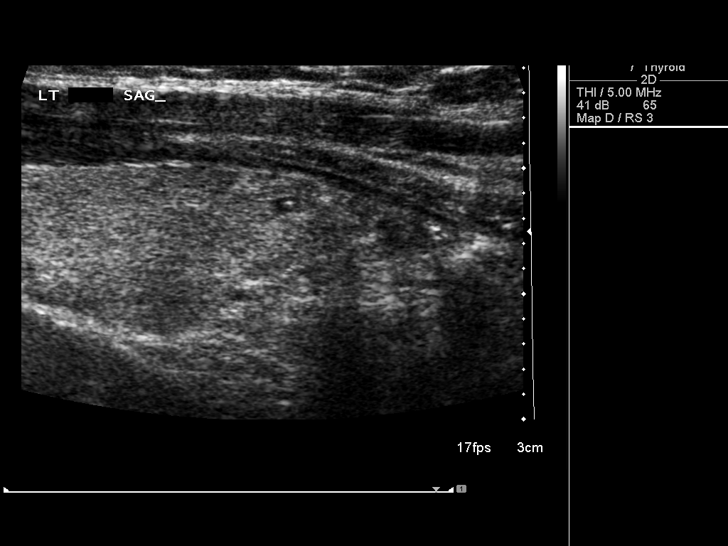
[im 43/43]
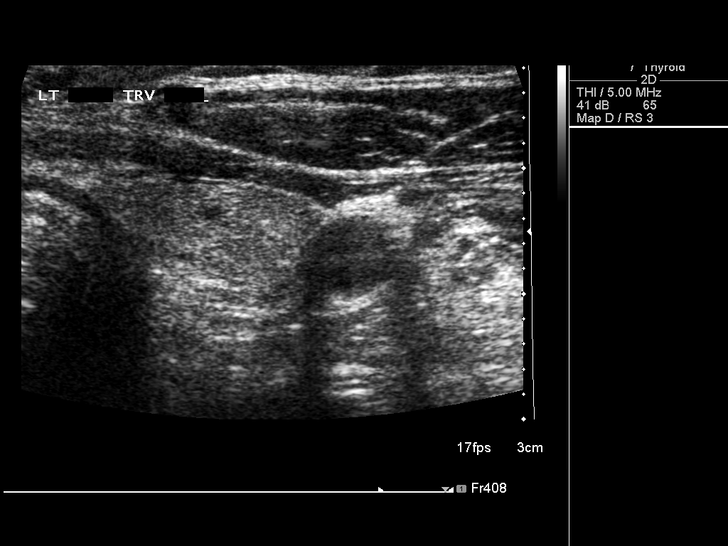

[13 of 25 positions shown; findings below may reference images not displayed]

FINDINGS: Right thyroid lobe

Measurements: 6.4 x 2.6 x 3.4 cm. Dominant mixed nodule in the mid
to inferior aspect of the right thyroid gland measures approximately
4.0 x 2.2 x 3.7 cm. This nodule is roughly half solid and half
cystic. The nodule is noncalcified.

Left thyroid lobe

Measurements: 5.5 x 1.3 x 1.9 cm. Ovoid nodular area measures
approximately 1.4 x 0.6 x 1.1 cm. This area contains foci of
shadowing calcification.

Isthmus

Thickness: 0.4 cm.  No nodules visualized.

Lymphadenopathy

None visualized.
IMPRESSION: Dominant 4 cm partially solid and partially cystic right thyroid
nodule. This nodule meets size criteria for biopsy. The smaller left
thyroid nodule does not meet biopsy criteria and should be followed
with ultrasound.

Findings on the right meet consensus criteria for biopsy.
Ultrasound-guided fine needle aspiration should be considered, as
per the consensus statement: Management of Thyroid Nodules Detected
at US: Society of Radiologists in Ultrasound Consensus Conference

## 2017-07-10 DIAGNOSIS — IMO0002 Reserved for concepts with insufficient information to code with codable children: Secondary | ICD-10-CM | POA: Insufficient documentation

## 2017-07-10 DIAGNOSIS — E1165 Type 2 diabetes mellitus with hyperglycemia: Secondary | ICD-10-CM

## 2017-07-10 HISTORY — DX: Reserved for concepts with insufficient information to code with codable children: IMO0002

## 2017-07-10 HISTORY — DX: Type 2 diabetes mellitus with hyperglycemia: E11.65

## 2019-04-23 ENCOUNTER — Other Ambulatory Visit: Payer: Self-pay

## 2019-04-23 ENCOUNTER — Ambulatory Visit: Payer: BC Managed Care – PPO | Attending: Family Medicine | Admitting: Physical Therapy

## 2019-04-23 DIAGNOSIS — M25512 Pain in left shoulder: Secondary | ICD-10-CM | POA: Insufficient documentation

## 2019-04-23 DIAGNOSIS — G8929 Other chronic pain: Secondary | ICD-10-CM | POA: Diagnosis present

## 2019-04-23 DIAGNOSIS — M25612 Stiffness of left shoulder, not elsewhere classified: Secondary | ICD-10-CM | POA: Insufficient documentation

## 2019-04-23 DIAGNOSIS — M62838 Other muscle spasm: Secondary | ICD-10-CM | POA: Insufficient documentation

## 2019-04-23 DIAGNOSIS — M5412 Radiculopathy, cervical region: Secondary | ICD-10-CM | POA: Diagnosis present

## 2019-04-23 NOTE — Therapy (Signed)
[place], [place], [address] Phone: [phone]   Fax:  [phone]  Physical Therapy Evaluation  Patient Details  Name: Joanne Reed MRN: [id number] Date of Birth: Mar 23, 1971 Referring Provider (PT): Dr Cecille Amsterdam   Encounter Date: 04/23/2019  PT End of Session - 04/23/19 0827    Visit Number  1    Number of Visits  12    Date for PT Re-Evaluation  06/04/19    Authorization Type  BCBS    PT Start Time  0807   Patient 7 minutes late   PT Stop Time  0850    PT Time Calculation (min)  43 min    Activity Tolerance  Patient tolerated treatment well    Behavior During Therapy  [facility] for tasks assessed/performed       Past Medical History:  Diagnosis Date  . Anxiety   . Diabetes mellitus without complication   . Hyperlipidemia   . Migraine     Past Surgical History:  Procedure Laterality Date  . ABDOMINAL HYSTERECTOMY  2009   R-[facility]--Dr. Quincy Simmonds  . [place]  . cholecystectomy      There were no vitals filed for this visit.   Subjective Assessment - 04/23/19 0813    Subjective  Patient has been having left shoulder and neck pain that has been going on for a few years. She had a cortisone shot but it only lasted a month.She is taking Naproxyn which is also not working. She is having increasing pain and decreasing function over time    Limitations  Standing    How long can you sit comfortably?  N/A    How long can you stand comfortably?  N/A    How long can you walk comfortably?  N/A    Diagnostic tests  patient reports she had an x-ray a long time ago that showed degeneration of her cervical spine    Patient Stated Goals  to have less pain and improved use of her shoulder    Currently in Pain?  Yes    Aggravating Factors   reaching,    Pain Relieving Factors  Nothing makes the neck better    Effect of Pain on Daily Activities  difficulty using her left arm          [facility] PT Assessment - 04/23/19 0001      Assessment   Medical Diagnosis  Left shoulder pain; Cervical pain     Referring Provider (PT)  Dr Cecille Amsterdam    Onset Date/Surgical Date  --   2-3 years    Hand Dominance  Right    Next MD Visit  Not for the shoulder     Prior Therapy  None       Precautions   Precautions  None      Restrictions   Weight Bearing Restrictions  No      Balance Screen   Has the patient fallen in the past 6 months  No    Has the patient had a decrease in activity level because of a fear of falling?   No    Is the patient reluctant to leave their home because of a fear of falling?   No      Home Environment   Additional Comments  nothing significant       Prior Function   Level of Independence  Independent    Vocation  Full time employment  Vocation Requirements  special education teacher     Leisure  walking       Cognition   Overall Cognitive Status  Within Functional Limits for tasks assessed    Attention  Focused    Focused Attention  Appears intact    Memory  Appears intact    Awareness  Appears intact    Problem Solving  Appears intact      Observation/Other Assessments   Focus on Therapeutic Outcomes (FOTO)   63% limitation       Sensation   Light Touch  Appears Intact    Additional Comments  numbness and tingling into her hand. Shoulder feels rubbery at times       Coordination   Gross Motor Movements are Fluid and Coordinated  Yes    Fine Motor Movements are Fluid and Coordinated  Yes      ROM / Strength   AROM / PROM / Strength  AROM;Strength;PROM      AROM   AROM Assessment Site  Shoulder;Cervical    Right/Left Shoulder  Left    Left Shoulder Flexion  80 Degrees    Left Shoulder Internal Rotation  --   lateral gluteal    Left Shoulder External Rotation  --   can reach to her ear   Cervical Flexion  40    Cervical Extension  30    Cervical - Right Rotation  55    Cervical - Left Rotation  40      PROM   PROM  Assessment Site  Shoulder    Right/Left Shoulder  Left    Left Shoulder Flexion  86 Degrees   with pain    Left Shoulder Internal Rotation  0 Degrees    Left Shoulder External Rotation  30 Degrees      Strength   Strength Assessment Site  Shoulder    Right/Left Shoulder  Left    Left Shoulder Flexion  3+/5    Left Shoulder ABduction  3+/5    Left Shoulder Internal Rotation  4/5    Left Shoulder External Rotation  3/5      Palpation   Palpation comment  significant spasming in the upper trap and the lateral shoulder       Special Tests   Other special tests  hawkins left (+) empty can (+)                 Objective measurements completed on examination: See above findings.      [facility] Adult PT Treatment/Exercise - 04/23/19 0001      Exercises   Exercises  Shoulder      Shoulder Exercises: Supine   Other Supine Exercises  supine wand ER 5x5sec hold       Shoulder Exercises: Seated   Other Seated Exercises  seated table slide 5x 5 sec hold     Other Seated Exercises  seated cervical rotation 3x in pain free range with cuing for posture       Manual Therapy   Manual Therapy  Joint mobilization;Soft tissue mobilization    Joint Mobilization  PA glides to improve flexion; AP glides to improve ER;     Soft tissue mobilization  soft tissue mobilization to upper trap and posterior shoulder;              PT Education - 04/23/19 0826    Education Details  HEP, symptom management    Person(s) Educated  Patient    Methods  Explanation;Demonstration;Tactile  cues;Verbal cues    Comprehension  Returned demonstration;Verbalized understanding;Verbal cues required;Tactile cues required       PT Short Term Goals - 04/23/19 1346      PT SHORT TERM GOAL #1   Title  Patient will increase left shoulder flexion by 20 degrees    Time  3    Period  Weeks    Status  New    Target Date  05/14/19      PT SHORT TERM GOAL #2   Title  Patient will will increase gross left  shoulder strength to 4/5    Time  3    Period  Weeks    Status  New      PT SHORT TERM GOAL #3   Title  Patient will increase cervical rotation by 25 degrees    Time  3    Period  Weeks    Status  New    Target Date  05/14/19        PT Long Term Goals - 04/23/19 1348      PT LONG TERM GOAL #1   Title  Patient will demonstrate 65 degrees of pain free bilateral cervical without pain in order to drive    Time  6    Period  Weeks    Status  New    Target Date  06/04/19      PT LONG TERM GOAL #2   Title  Patient will reach overhead to ashelf with left hand    Time  6    Target Date  06/04/19      PT LONG TERM GOAL #3   Title  Patient will reach behind her head without pain in order to do her hair    Time  6    Period  Weeks    Status  New    Target Date  06/04/19             Plan - 04/23/19 1210    Clinical Impression Statement  Patient is a 48 year old famel with cervical pain and muscle stiffness as well as significant limitations in left shoulder movement and fucntion. Her shoulder flexion is limited to 80 degrees with pain. She has a positive Hawkins and empty can test indicating likley impingement and RTC involvement. Her cervical rotation and felx/extension is limited and painful. She has tendenrness to palpation in her [facility] joint, lateral shoulder, and into her upper trap. She would beneift from skilled therapy to improve her function and her ability to perfrom ADL's with her left arm. She was seen for a moderate complexity evaluation.    Personal Factors and Comorbidities  Comorbidity 1;Comorbidity 2;Comorbidity 3+    Comorbidities  DMII, anxiety, migranes    Examination-Activity Limitations  Lift;Reach Overhead    Examination-Participation Restrictions  Laundry;Cleaning    Stability/Clinical Decision Making  Evolving/Moderate complexity   increasingly limited mobility   Clinical Decision Making  Moderate    Rehab Potential  Good    PT Frequency  2x / week     PT Duration  6 weeks    PT Treatment/Interventions  ADLs/Self Care Home Management;Cryotherapy;Electrical Stimulation;Moist Heat;Traction;Ultrasound;DME Instruction;Functional mobility training;Therapeutic activities;Therapeutic exercise;Neuromuscular re-education;Patient/family education;Manual techniques;Passive range of motion;Taping    PT Next Visit Plan  PROM and joint mobilizations to the shoulder; genlte manual traction to cervical spine, Spinal mobilizations    PT Home Exercise Plan  Table slide, cervical rotation in pain free range, wand ER    Consulted and Agree with  Plan of Care  Patient       Patient will benefit from skilled therapeutic intervention in order to improve the following deficits and impairments:  Impaired UE functional use, Decreased activity tolerance, Decreased safety awareness, Decreased strength, Improper body mechanics, Decreased range of motion, Decreased endurance, Increased muscle spasms  Visit Diagnosis: 1. Chronic left shoulder pain   2. Stiffness of left shoulder, not elsewhere classified   3. Radiculopathy, cervical region   4. Other muscle spasm        Problem List Patient Active Problem List   Diagnosis Date Noted  . Thyromegaly 09/20/2013    Carney Living PT DPT  04/23/2019, 2:04 PM  [facility] [address] [place], [place], [address] Phone: [phone]   Fax:  [phone]  Name: MARABELLA POPIEL MRN: [id number] Date of Birth: April 12, 1971

## 2019-05-02 ENCOUNTER — Ambulatory Visit: Payer: BC Managed Care – PPO | Admitting: Physical Therapy

## 2019-05-07 ENCOUNTER — Other Ambulatory Visit: Payer: Self-pay

## 2019-05-07 ENCOUNTER — Ambulatory Visit: Payer: BC Managed Care – PPO | Admitting: Physical Therapy

## 2019-05-07 ENCOUNTER — Encounter: Payer: Self-pay | Admitting: Physical Therapy

## 2019-05-07 DIAGNOSIS — G8929 Other chronic pain: Secondary | ICD-10-CM

## 2019-05-07 DIAGNOSIS — M25512 Pain in left shoulder: Secondary | ICD-10-CM | POA: Diagnosis not present

## 2019-05-07 DIAGNOSIS — M62838 Other muscle spasm: Secondary | ICD-10-CM

## 2019-05-07 DIAGNOSIS — M25612 Stiffness of left shoulder, not elsewhere classified: Secondary | ICD-10-CM

## 2019-05-07 DIAGNOSIS — M5412 Radiculopathy, cervical region: Secondary | ICD-10-CM

## 2019-05-07 NOTE — Therapy (Signed)
[place], [place], [address] Phone: [phone]   Fax:  [phone]  Physical Therapy Treatment  Patient Details  Name: Joanne Reed MRN: [id number] Date of Birth: [birth year] Referring Provider (PT): Dr Cecille Amsterdam   Encounter Date: 05/07/2019  PT End of Session - 05/07/19 1050    Visit Number  2    Number of Visits  12    Date for PT Re-Evaluation  06/04/19    Authorization Type  BCBS    PT Start Time  1050    PT Stop Time  1126    PT Time Calculation (min)  36 min       Past Medical History:  Diagnosis Date  . Anxiety   . Diabetes mellitus without complication ([place])   . Hyperlipidemia   . Migraine     Past Surgical History:  Procedure Laterality Date  . ABDOMINAL HYSTERECTOMY  2009   R-[facility]--Dr. Quincy Simmonds  . [place]  . cholecystectomy      There were no vitals filed for this visit.  Subjective Assessment - 05/07/19 1050    Subjective  Pt reports she is still having a lot of pain in the neck and shoulder,  the shoulder is worse.  She has been doing her HEP    Currently in Pain?  No/denies   intermittent pain = pulling up/down pants ,certain movements and lying on the Lt side        [facility] PT Assessment - 05/07/19 0001      Assessment   Medical Diagnosis  Left shoulder pain; Cervical pain     Referring Provider (PT)  Dr Cecille Amsterdam      AROM   Left Shoulder Flexion  116 Degrees   in supine   Cervical - Right Rotation  47    Cervical - Left Rotation  52                   [facility] Adult PT Treatment/Exercise - 05/07/19 0001      Shoulder Exercises: Supine   Flexion  AAROM;10 reps   with dowel, chest press, then overhead   Other Supine Exercises  cervical / head presses 10 x 5 sec      Shoulder Exercises: Seated   External Rotation  Strengthening;Both;10 reps;Theraband   with scap retraction   Theraband Level (Shoulder External Rotation)   Level 2 (Red)    Other Seated Exercises  10 rewps BWD shoulder rolls      Shoulder Exercises: Isometric Strengthening   External Rotation  5X5"   Lt   Internal Rotation  5X5"   Lt     Manual Therapy   Soft tissue mobilization  STM to Lt upper trap/levator,/pecs and RTC muscles - pt with increased tissue pliability after tx.        Trigger Point Dry Needling - 05/07/19 0001    Consent Given?  Yes    Education Handout Provided  Previously provided    Muscles Treated Head and Neck  Upper trapezius    Muscles Treated Upper Quadrant  Pectoralis major;Subscapularis    Upper Trapezius Response  Palpable increased muscle length;Twitch reponse elicited   Lt in supine   Pectoralis Major Response  Palpable increased muscle length;Twitch response elicited   Lt   Subscapularis Response  Palpable increased muscle length;Twitch response elicited   Lt          PT Education - 05/07/19 1059  Education Details  HEP progression    Person(s) Educated  Patient    Methods  Explanation;Demonstration;Handout    Comprehension  Returned demonstration;Verbalized understanding;Verbal cues required       PT Short Term Goals - 05/07/19 1119      PT SHORT TERM GOAL #1   Title  Patient will increase left shoulder flexion by 20 degrees    Status  Achieved      PT SHORT TERM GOAL #2   Title  Patient will will increase gross left shoulder strength to 4/5    Status  On-going      PT SHORT TERM GOAL #3   Title  Patient will increase cervical rotation by 25 degrees        PT Long Term Goals - 04/23/19 1348      PT LONG TERM GOAL #1   Title  Patient will demonstrate 65 degrees of pain free bilateral cervical without pain in order to drive    Time  6    Period  Weeks    Status  New    Target Date  06/04/19      PT LONG TERM GOAL #2   Title  Patient will reach overhead to ashelf with left hand    Time  6    Target Date  06/04/19      PT LONG TERM GOAL #3   Title  Patient will reach  behind her head without pain in order to do her hair    Time  6    Period  Weeks    Status  New    Target Date  06/04/19            Plan - 05/07/19 1127    Clinical Impression Statement  Joanne Reed has been performing her HEP, she is still having a fair amount of pain in her neck and Lt shoulder.  did well with manual work today, had improved Lt shoulder flexion and met that goal.  Cervical is still tight and limited.  Her muscular tightness decreased after manual work today, she may need more over the next couple of weeks.  HEP was progressed to begin getting RTC activation and scapular stabilizatoin. She missed a couple weeks because her sister was dx with throat CA and had a mass removed.    Rehab Potential  Good    PT Frequency  2x / week    PT Duration  6 weeks    PT Treatment/Interventions  ADLs/Self Care Home Management;Cryotherapy;Electrical Stimulation;Moist Heat;Traction;Ultrasound;DME Instruction;Functional mobility training;Therapeutic activities;Therapeutic exercise;Neuromuscular re-education;Patient/family education;Manual techniques;Passive range of motion;Taping    PT Next Visit Plan  assess response to new HEP and DN/manual work, cervical mobs, PROM    Consulted and Agree with Plan of Care  Patient       Patient will benefit from skilled therapeutic intervention in order to improve the following deficits and impairments:  Impaired UE functional use, Decreased activity tolerance, Decreased safety awareness, Decreased strength, Improper body mechanics, Decreased range of motion, Decreased endurance, Increased muscle spasms  Visit Diagnosis: 1. Chronic left shoulder pain   2. Stiffness of left shoulder, not elsewhere classified   3. Radiculopathy, cervical region   4. Other muscle spasm        Problem List Patient Active Problem List   Diagnosis Date Noted  . Thyromegaly 09/20/2013    Joanne Reed PT  05/07/2019, 11:29 AM  [facility] [address] [place], [place], [address] Phone: [phone]  Fax:  [phone]  Name: Joanne Reed MRN: [id number] Date of Birth: 1970/12/03

## 2019-05-09 ENCOUNTER — Ambulatory Visit: Payer: BC Managed Care – PPO | Admitting: Physical Therapy

## 2019-05-13 ENCOUNTER — Ambulatory Visit: Payer: BC Managed Care – PPO | Admitting: Physical Therapy

## 2019-05-13 ENCOUNTER — Telehealth: Payer: Self-pay | Admitting: Physical Therapy

## 2019-05-13 NOTE — Telephone Encounter (Signed)
Attempted to call patient, she missed todays appointment for PT.  Her mailbox was full, unable to  Leave a message.  She has no further scheduled visits.  If she calls and reschedules will need attendance policy reviewed.

## 2019-05-22 ENCOUNTER — Encounter: Payer: Self-pay | Admitting: Physical Therapy

## 2019-05-22 ENCOUNTER — Ambulatory Visit: Payer: BC Managed Care – PPO | Attending: Family Medicine | Admitting: Physical Therapy

## 2019-05-22 ENCOUNTER — Other Ambulatory Visit: Payer: Self-pay

## 2019-05-22 DIAGNOSIS — M25612 Stiffness of left shoulder, not elsewhere classified: Secondary | ICD-10-CM | POA: Insufficient documentation

## 2019-05-22 DIAGNOSIS — M62838 Other muscle spasm: Secondary | ICD-10-CM

## 2019-05-22 DIAGNOSIS — G8929 Other chronic pain: Secondary | ICD-10-CM | POA: Insufficient documentation

## 2019-05-22 DIAGNOSIS — M25512 Pain in left shoulder: Secondary | ICD-10-CM | POA: Diagnosis present

## 2019-05-22 DIAGNOSIS — M5412 Radiculopathy, cervical region: Secondary | ICD-10-CM | POA: Insufficient documentation

## 2019-05-22 NOTE — Therapy (Addendum)
[place], [place], [address] Phone: [phone]   Fax:  [phone]  Physical Therapy Treatment/Discharge   Patient Details  Name: Joanne Reed MRN: [id number] Date of Birth: [birth year] Referring Provider (PT): Dr Cecille Amsterdam   Encounter Date: 05/22/2019  PT End of Session - 05/22/19 1147    Visit Number  3    Number of Visits  12    Date for PT Re-Evaluation  06/04/19    Authorization Type  BCBS    PT Start Time  2542    PT Stop Time  1223    PT Time Calculation (min)  38 min       Past Medical History:  Diagnosis Date  . Anxiety   . Diabetes mellitus without complication ([place])   . Hyperlipidemia   . Migraine     Past Surgical History:  Procedure Laterality Date  . ABDOMINAL HYSTERECTOMY  2009   R-[facility]--Dr. Quincy Simmonds  . [place]  . cholecystectomy      There were no vitals filed for this visit.  Subjective Assessment - 05/22/19 1148    Subjective  neck and shoulder seem to get better and then starts back up.    Currently in Pain?  Yes    Pain Score  4     Pain Location  Neck    Pain Orientation  Left    Pain Descriptors / Indicators  Aching    Pain Type  Chronic pain    Aggravating Factors   reaching    Pain Relieving Factors  rest.         [facility] PT Assessment - 05/22/19 0001      AROM   Left Shoulder Internal Rotation  --   PROM 45   Left Shoulder External Rotation  --   PROM 90                  [facility] Adult PT Treatment/Exercise - 05/22/19 0001      Shoulder Exercises: Supine   Horizontal ABduction  10 reps    Theraband Level (Shoulder Horizontal ABduction)  Level 2 (Red)    External Rotation  AAROM;Left;10 reps    External Rotation Limitations  with dowel    Flexion  AAROM;10 reps   with dowel, chest press, then overhead   Other Supine Exercises  cervical / head presses 10 x 5 sec      Shoulder Exercises: Seated   Retraction  10  reps    External Rotation  Strengthening;Both;10 reps;Theraband   with scap retraction   Theraband Level (Shoulder External Rotation)  Level 2 (Red)    Other Seated Exercises  chin tucks     Other Seated Exercises  seated  cervical side bend and cervical rotation 3x in pain free range with cuing for posture       Shoulder Exercises: Isometric Strengthening   External Rotation  --    Internal Rotation  5X5"   Lt     Manual Therapy   Soft tissue mobilization  trigger point release to left upper trap, PROM side bend and cervical rotation, levator stretch       Neck Exercises: Stretches   Upper Trapezius Stretch  Left;3 reps    Levator Stretch  Left;3 reps               PT Short Term Goals - 05/07/19 1119      PT SHORT TERM GOAL #1  Title  Patient will increase left shoulder flexion by 20 degrees    Status  Achieved      PT SHORT TERM GOAL #2   Title  Patient will will increase gross left shoulder strength to 4/5    Status  On-going      PT SHORT TERM GOAL #3   Title  Patient will increase cervical rotation by 25 degrees        PT Long Term Goals - 04/23/19 1348      PT LONG TERM GOAL #1   Title  Patient will demonstrate 65 degrees of pain free bilateral cervical without pain in order to drive    Time  6    Period  Weeks    Status  New    Target Date  06/04/19      PT LONG TERM GOAL #2   Title  Patient will reach overhead to ashelf with left hand    Time  6    Target Date  06/04/19      PT LONG TERM GOAL #3   Title  Patient will reach behind her head without pain in order to do her hair    Time  6    Period  Weeks    Status  New    Target Date  06/04/19            Plan - 05/22/19 1253    Clinical Impression Statement  Pt reports continued difficulty with reaching up and behind head or back. Pain increased from 4/10 to 5/10 with therapeuic exercises for shoulder and neck. Manual trigger point release and PROM to lengthen left upper trap performed  as well as PROM to left shoulder. Patient declined moist heat at end of session.    PT Next Visit Plan  assess response to new HEP and DN/manual work, cervical mobs, PROM, shoulder IR stretch    PT Home Exercise Plan  Table slide, cervical rotation in pain free range, wand ER, isometric IR with self resist, ER with Red band       Patient will benefit from skilled therapeutic intervention in order to improve the following deficits and impairments:  Impaired UE functional use, Decreased activity tolerance, Decreased safety awareness, Decreased strength, Improper body mechanics, Decreased range of motion, Decreased endurance, Increased muscle spasms  Visit Diagnosis: 1. Stiffness of left shoulder, not elsewhere classified   2. Chronic left shoulder pain   3. Radiculopathy, cervical region   4. Other muscle spasm     PHYSICAL THERAPY DISCHARGE SUMMARY  Visits from Start of Care: 3  Current functional level related to goals / functional outcomes: Did not return for follow ip    Remaining deficits: Did not return    Education / Equipment: Unknown   Plan: Patient agrees to discharge.  Patient goals were not met. Patient is being discharged due to meeting the stated rehab goals.  ?????       Problem List Patient Active Problem List   Diagnosis Date Noted  . Thyromegaly 09/20/2013   Carolyne Littles PT DPT  08/27/2019   Dorene Ar, PTA 05/22/2019, 12:56 PM  [facility] [address] [place], [place], [address] Phone: [phone]   Fax:  [phone]  Name: Joanne Reed MRN: [id number] Date of Birth: September 14, 1971

## 2019-05-24 ENCOUNTER — Encounter

## 2019-05-31 ENCOUNTER — Encounter

## 2019-06-03 ENCOUNTER — Ambulatory Visit: Payer: BC Managed Care – PPO | Admitting: Physical Therapy

## 2019-06-10 ENCOUNTER — Ambulatory Visit: Payer: BC Managed Care – PPO | Admitting: Physical Therapy

## 2019-06-17 ENCOUNTER — Ambulatory Visit: Payer: BC Managed Care – PPO | Admitting: Physical Therapy

## 2019-06-25 ENCOUNTER — Ambulatory Visit: Payer: BC Managed Care – PPO | Admitting: Physical Therapy

## 2019-07-02 ENCOUNTER — Ambulatory Visit: Payer: BC Managed Care – PPO | Admitting: Physical Therapy

## 2019-07-09 ENCOUNTER — Encounter: Payer: BC Managed Care – PPO | Admitting: Physical Therapy

## 2019-07-16 ENCOUNTER — Encounter: Payer: BC Managed Care – PPO | Admitting: Physical Therapy

## 2019-07-30 ENCOUNTER — Emergency Department (HOSPITAL_COMMUNITY)
Admission: EM | Admit: 2019-07-30 | Discharge: 2019-07-31 | Disposition: A | Discharge status: Home / Self Care | Payer: BC Managed Care – PPO | Attending: Emergency Medicine | Admitting: Emergency Medicine

## 2019-07-30 ENCOUNTER — Other Ambulatory Visit: Payer: Self-pay

## 2019-07-30 ENCOUNTER — Encounter (HOSPITAL_COMMUNITY): Payer: Self-pay | Admitting: Emergency Medicine

## 2019-07-30 DIAGNOSIS — E119 Type 2 diabetes mellitus without complications: Secondary | ICD-10-CM | POA: Insufficient documentation

## 2019-07-30 DIAGNOSIS — K5900 Constipation, unspecified: Secondary | ICD-10-CM | POA: Insufficient documentation

## 2019-07-30 DIAGNOSIS — R1033 Periumbilical pain: Secondary | ICD-10-CM | POA: Insufficient documentation

## 2019-07-30 DIAGNOSIS — R109 Unspecified abdominal pain: Secondary | ICD-10-CM

## 2019-07-30 DIAGNOSIS — Z87891 Personal history of nicotine dependence: Secondary | ICD-10-CM | POA: Diagnosis not present

## 2019-07-30 HISTORY — DX: Pure hypercholesterolemia, unspecified: E78.00

## 2019-07-30 LAB — URINALYSIS, ROUTINE W REFLEX MICROSCOPIC
Bacteria, UA: NONE SEEN
Bilirubin Urine: NEGATIVE
Glucose, UA: NEGATIVE mg/dL
Hgb urine dipstick: NEGATIVE
Ketones, ur: NEGATIVE mg/dL
Nitrite: NEGATIVE
Protein, ur: NEGATIVE mg/dL
Specific Gravity, Urine: 1.008 (ref 1.005–1.030)
pH: 8 (ref 5.0–8.0)

## 2019-07-30 LAB — COMPREHENSIVE METABOLIC PANEL
ALT: 23 U/L (ref 0–44)
AST: 19 U/L (ref 15–41)
Albumin: 4.1 g/dL (ref 3.5–5.0)
Alkaline Phosphatase: 72 U/L (ref 38–126)
Anion gap: 11 (ref 5–15)
BUN: 16 mg/dL (ref 6–20)
CO2: 27 mmol/L (ref 22–32)
Calcium: 9.7 mg/dL (ref 8.9–10.3)
Chloride: 102 mmol/L (ref 98–111)
Creatinine, Ser: 0.63 mg/dL (ref 0.44–1.00)
GFR calc Af Amer: >60 mL/min (ref >60)
GFR calc non Af Amer: >60 mL/min (ref >60)
Glucose, Bld: 107 mg/dL — ABNORMAL HIGH (ref 70–99)
Potassium: 4.2 mmol/L (ref 3.5–5.1)
Sodium: 140 mmol/L (ref 135–145)
Total Bilirubin: 0.9 mg/dL (ref 0.3–1.2)
Total Protein: 6.9 g/dL (ref 6.5–8.1)

## 2019-07-30 LAB — I-STAT BETA HCG BLOOD, ED (MC, WL, AP ONLY): I-stat hCG, quantitative: <5 m[IU]/mL (ref <5)

## 2019-07-30 LAB — CBC
HCT: 46.2 % — ABNORMAL HIGH (ref 36.0–46.0)
Hemoglobin: 15 g/dL (ref 12.0–15.0)
MCH: 29.9 pg (ref 26.0–34.0)
MCHC: 32.5 g/dL (ref 30.0–36.0)
MCV: 92 fL (ref 80.0–100.0)
Platelets: 371 10*3/uL (ref 150–400)
RBC: 5.02 MIL/uL (ref 3.87–5.11)
RDW: 13 % (ref 11.5–15.5)
WBC: 8.6 10*3/uL (ref 4.0–10.5)
nRBC: 0 % (ref 0.0–0.2)

## 2019-07-30 LAB — LIPASE, BLOOD: Lipase: 30 U/L (ref 11–51)

## 2019-07-30 MED ORDER — SODIUM CHLORIDE 0.9% FLUSH: 3.0000 mL | Freq: Once | INTRAVENOUS | Status: DC

## 2019-07-30 NOTE — ED Triage Notes (Signed)
Pt reports for the last 5 days has had abdominal pain. Reports subjective fevers at home. Sent over by PCp to rule out appy. VSS. NAD at present.

## 2019-07-31 ENCOUNTER — Encounter: Payer: Self-pay | Admitting: Physical Therapy

## 2019-07-31 ENCOUNTER — Emergency Department (HOSPITAL_COMMUNITY): Payer: BC Managed Care – PPO

## 2019-07-31 MED ORDER — DICYCLOMINE HCL 20 MG PO TABS: 20.0000 mg | ORAL_TABLET | Freq: Two times a day (BID) | ORAL | 0 refills | Status: DC

## 2019-07-31 MED ORDER — IOHEXOL 300 MG/ML  SOLN
100.0000 mL | Freq: Once | INTRAMUSCULAR | Status: AC | PRN
  Administered 2019-07-31: 100 mL via INTRAVENOUS

## 2019-07-31 MED ORDER — ONDANSETRON HCL 4 MG/2ML IJ SOLN: 4.0000 mg | Freq: Once | INTRAMUSCULAR | Status: DC

## 2019-07-31 MED ORDER — DOCUSATE SODIUM 100 MG PO CAPS: 100.0000 mg | ORAL_CAPSULE | Freq: Two times a day (BID) | ORAL | 0 refills | Status: AC

## 2019-07-31 MED ORDER — SODIUM CHLORIDE 0.9 % IV BOLUS: 1000.0000 mL | Freq: Once | INTRAVENOUS | Status: DC

## 2019-07-31 MED ORDER — MORPHINE SULFATE (PF) 4 MG/ML IV SOLN: 4.0000 mg | Freq: Once | INTRAVENOUS | Status: DC

## 2019-07-31 MED ORDER — PEG 3350-KCL-NABCB-NACL-NASULF 236 G PO SOLR: 4000.0000 mL | Freq: Once | ORAL | 0 refills | Status: AC

## 2019-07-31 NOTE — ED Provider Notes (Signed)
[facility] EMERGENCY DEPARTMENT Provider Note   CSN: [id number] Arrival date & time: 07/30/19  1603     History   Chief Complaint Chief Complaint  Patient presents with  . Abdominal Pain    HPI Joanne Reed is a 48 y.o. female.     HPI   Joanne Reed is a 48 y.o. female, with a history of DM and hypercholesteremia, presenting to the ED with abdominal pain beginning around October 8. Pain originally was intermittent, but has become constant over the last 24 hours.  Located periumbilical, sharp, [id number], now radiating toward the lower back. Accompanied by diarrhea and anorexia. Patient's PCP recommended she come to the ED to rule out appendicitis.  Denies fever/chills, N/V, abnormal vaginal discharge, vaginal bleeding, chest pain, shortness of breath, hematochezia/melena, dysuria/hematuria, or any other complaints.   Past Medical History:  Diagnosis Date  . Diabetes mellitus without complication ([place])   . Hypercholesteremia   . Migraine     There are no active problems to display for this patient.   Past Surgical History:  Procedure Laterality Date  . ABDOMINAL HYSTERECTOMY    . CESAREAN SECTION       OB History   No obstetric history on file.      Home Medications    Prior to Admission medications   Medication Sig Start Date End Date Taking? Authorizing Provider  docusate sodium (COLACE) 100 MG capsule Take 1 capsule (100 mg total) by mouth every 12 (twelve) hours for 15 days. 07/31/19 08/15/19  ,  C, PA-C  polyethylene glycol (GOLYTELY/NULYTELY) 236 g solution Take 4,000 mLs by mouth once for 1 dose. 07/31/19 07/31/19  Lorayne Bender, PA-C    Family History History reviewed. No pertinent family history.  Social History Social History   Tobacco Use  . Smoking status: Former Smoker  Substance Use Topics  . Alcohol use: Never    Frequency: Never  . Drug use: Never     Allergies   Patient has no known allergies.    Review of Systems Review of Systems  Constitutional: Negative for chills and fever.  Respiratory: Negative for cough and shortness of breath.   Cardiovascular: Negative for chest pain.  Gastrointestinal: Positive for abdominal pain and diarrhea. Negative for blood in stool, nausea and vomiting.  Genitourinary: Negative for dysuria, flank pain, frequency, hematuria, vaginal bleeding and vaginal discharge.  Neurological: Negative for syncope and weakness.  All other systems reviewed and are negative.    Physical Exam Updated Vital Signs BP 124/84 (BP Location: Right Arm)   Pulse 72   Temp 97.6 F (36.4 C) (Oral)   Resp 18   SpO2 100%   Physical Exam Vitals signs and nursing note reviewed.  Constitutional:      General: She is not in acute distress.    Appearance: She is well-developed. She is not diaphoretic.  HENT:     Head: Normocephalic and atraumatic.     Mouth/Throat:     Mouth: Mucous membranes are moist.     Pharynx: Oropharynx is clear.  Eyes:     Conjunctiva/sclera: Conjunctivae normal.  Neck:     Musculoskeletal: Neck supple.  Cardiovascular:     Rate and Rhythm: Normal rate and regular rhythm.     Pulses: Normal pulses.          Radial pulses are 2+ on the right side and 2+ on the left side.       Posterior tibial pulses are 2+  on the right side and 2+ on the left side.     Heart sounds: Normal heart sounds.  Pulmonary:     Effort: Pulmonary effort is normal. No respiratory distress.     Breath sounds: Normal breath sounds.  Abdominal:     Palpations: Abdomen is soft.     Tenderness: There is abdominal tenderness. There is no right CVA tenderness, left CVA tenderness or guarding.    Musculoskeletal:     Right lower leg: No edema.     Left lower leg: No edema.  Lymphadenopathy:     Cervical: No cervical adenopathy.  Skin:    General: Skin is warm and dry.  Neurological:     Mental Status: She is alert.  Psychiatric:        Mood and Affect: Mood and  affect normal.        Speech: Speech normal.        Behavior: Behavior normal.      ED Treatments / Results  Labs (all labs ordered are listed, but only abnormal results are displayed) Labs Reviewed  COMPREHENSIVE METABOLIC PANEL - Abnormal; Notable for the following components:      Result Value   Glucose, Bld 107 (*)    All other components within normal limits  CBC - Abnormal; Notable for the following components:   HCT 46.2 (*)    All other components within normal limits  URINALYSIS, ROUTINE W REFLEX MICROSCOPIC - Abnormal; Notable for the following components:   Color, Urine STRAW (*)    Leukocytes,Ua TRACE (*)    All other components within normal limits  LIPASE, BLOOD  I-STAT BETA HCG BLOOD, ED ([facility], [facility], [facility] ONLY)    EKG None  Radiology Ct Abdomen Pelvis W Contrast  Result Date: 07/31/2019 CLINICAL DATA:  Acute abdominal pain with appendicitis suspected EXAM: CT ABDOMEN AND PELVIS WITH CONTRAST TECHNIQUE: Multidetector CT imaging of the abdomen and pelvis was performed using the standard protocol following bolus administration of intravenous contrast. CONTRAST:  144mL OMNIPAQUE IOHEXOL 300 MG/ML  SOLN COMPARISON:  None. FINDINGS: Lower chest:  No contributory findings. Hepatobiliary: No focal liver abnormality.Cholecystectomy. No bile duct dilatation. Pancreas: Unremarkable. Spleen: Unremarkable. Adrenals/Urinary Tract: Negative adrenals. No hydronephrosis or ureteral stone. 4 mm left lower pole renal calculus. Unremarkable bladder. Stomach/Bowel: No obstruction. No appendicitis. Formed stool seen throughout the colon without rectal impaction Vascular/Lymphatic: No acute vascular abnormality. No mass or adenopathy. Minimal atherosclerotic calcification. Reproductive:No pathologic findings. Other: No ascites or pneumoperitoneum. Musculoskeletal: No acute abnormalities. Exaggerated lumbar lordosis. Notable right L5-S1 degenerative facet spurring. IMPRESSION: 1. Negative for  appendicitis. 2. Formed stool throughout the colon. 3. Nonobstructive 4 mm left renal calculus. Electronically Signed   By: Monte Fantasia M.D.   On: 07/31/2019 05:06    Procedures Procedures (including critical care time)  Medications Ordered in ED Medications  sodium chloride flush (NS) 0.9 % injection 3 mL (has no administration in time range)  iohexol (OMNIPAQUE) 300 MG/ML solution 100 mL (100 mLs Intravenous Contrast Given 07/31/19 0439)     Initial Impression / Assessment and Plan / ED Course  I have reviewed the triage vital signs and the nursing notes.  Pertinent labs & imaging results that were available during my care of the patient were reviewed by me and considered in my medical decision making (see chart for details).        Patient presents with abdominal cramping, diarrhea, and loss of appetite. Patient is nontoxic appearing, afebrile, not tachycardic, not tachypneic,  not hypotensive, maintains excellent SPO2 on room air, and is in no apparent distress.  No leukocytosis.  Other lab work overall reassuring. CT shows formed stool throughout the colon without evidence of obstruction. My suspicion for reproductive origin of patient's symptoms, such as ovarian torsion, is low based on patient's description of her symptoms, their duration without significant decline in patient's status, and location of abdominal tenderness. The patient was given instructions for home care as well as return precautions. Patient voices understanding of these instructions, accepts the plan, and is comfortable with discharge.  Note: Analgesia and antinausea medication was ordered for the patient, however, she was taken to CT before these medications could be given.  Upon return from CT, I gave her the option to still receive these medications, however, she declined, instead opting for discharge.  Findings and plan of care discussed with Malachy Moan, MD.   Vitals:   07/30/19 1630 07/30/19 2346   BP: 112/71 124/84  Pulse: 88 72  Resp: 16 18  Temp: 98.4 F (36.9 C) 97.6 F (36.4 C)  TempSrc: Oral Oral  SpO2: 100% 100%     Final Clinical Impressions(s) / ED Diagnoses   Final diagnoses:  Abdominal cramping  Constipation, unspecified constipation type    ED Discharge Orders         Ordered    docusate sodium (COLACE) 100 MG capsule  Every 12 hours     07/31/19 0532    polyethylene glycol (GOLYTELY/NULYTELY) 236 g solution   Once     07/31/19 0532           Lorayne Bender, PA-C 07/31/19 0538    Orpah Greek, MD 07/31/19 [phone]

## 2019-07-31 NOTE — Discharge Instructions (Signed)
Constipation  You have evidence of constipation. Please follow the instructions below:  Hydration: You should start drinking at least ten, 8 oz glasses of water a day to help relieve your constipation. Baseline hydration should be at least eight, 8 oz glasses of water a day. This is a daily amount that you should be drinking, even without your current issue. Proper hydration not only helps prevent constipation, it is essential for any of the following treatments to be effective.  Fiber: Begin taking the fiber supplement daily. You should also increase the fiber in your diet.  Colace: This medication is a stool softener and may be taken prior to trying the MiraLAX.  MiraLAX: You may begin taking MiraLAX daily until you are having at least 1 soft bowel movement a day.  GoLYTELY: If several days of using the MiraLAX does not resolve symptoms, may try the GoLYTELY bowel prep.  Dicyclomine: Dicyclomine (generic for Bentyl) is what is known as an antispasmodic and is intended to help reduce abdominal discomfort.  Follow-up: Follow-up with your primary care provider as soon as possible for continued management of this issue.  Return: Return to the ED should any symptoms worsen.  For prescription assistance, may try using prescription discount sites or apps, such as goodrx.com

## 2019-10-18 DIAGNOSIS — G459 Transient cerebral ischemic attack, unspecified: Secondary | ICD-10-CM

## 2019-10-18 HISTORY — DX: Transient cerebral ischemic attack, unspecified: G45.9

## 2019-12-13 DIAGNOSIS — F419 Anxiety disorder, unspecified: Secondary | ICD-10-CM

## 2019-12-13 DIAGNOSIS — R200 Anesthesia of skin: Secondary | ICD-10-CM

## 2019-12-13 DIAGNOSIS — E785 Hyperlipidemia, unspecified: Secondary | ICD-10-CM

## 2019-12-13 DIAGNOSIS — R471 Dysarthria and anarthria: Secondary | ICD-10-CM

## 2019-12-14 DIAGNOSIS — F419 Anxiety disorder, unspecified: Secondary | ICD-10-CM | POA: Diagnosis not present

## 2019-12-14 DIAGNOSIS — G459 Transient cerebral ischemic attack, unspecified: Secondary | ICD-10-CM

## 2019-12-14 DIAGNOSIS — R471 Dysarthria and anarthria: Secondary | ICD-10-CM | POA: Diagnosis not present

## 2019-12-14 DIAGNOSIS — E785 Hyperlipidemia, unspecified: Secondary | ICD-10-CM | POA: Diagnosis not present

## 2019-12-14 DIAGNOSIS — R200 Anesthesia of skin: Secondary | ICD-10-CM | POA: Diagnosis not present

## 2019-12-15 DIAGNOSIS — R200 Anesthesia of skin: Secondary | ICD-10-CM | POA: Diagnosis not present

## 2019-12-15 DIAGNOSIS — E785 Hyperlipidemia, unspecified: Secondary | ICD-10-CM | POA: Diagnosis not present

## 2019-12-15 DIAGNOSIS — F419 Anxiety disorder, unspecified: Secondary | ICD-10-CM | POA: Diagnosis not present

## 2019-12-15 DIAGNOSIS — R471 Dysarthria and anarthria: Secondary | ICD-10-CM | POA: Diagnosis not present

## 2019-12-16 DIAGNOSIS — R471 Dysarthria and anarthria: Secondary | ICD-10-CM | POA: Diagnosis not present

## 2019-12-16 DIAGNOSIS — R200 Anesthesia of skin: Secondary | ICD-10-CM | POA: Diagnosis not present

## 2019-12-16 DIAGNOSIS — E785 Hyperlipidemia, unspecified: Secondary | ICD-10-CM | POA: Diagnosis not present

## 2019-12-16 DIAGNOSIS — F419 Anxiety disorder, unspecified: Secondary | ICD-10-CM | POA: Diagnosis not present

## 2020-01-01 ENCOUNTER — Ambulatory Visit: Payer: BC Managed Care – PPO | Admitting: Neurology

## 2020-01-01 ENCOUNTER — Telehealth: Payer: Self-pay | Admitting: Neurology

## 2020-01-01 ENCOUNTER — Encounter: Payer: Self-pay | Admitting: Neurology

## 2020-01-01 ENCOUNTER — Other Ambulatory Visit: Payer: Self-pay

## 2020-01-01 VITALS — BP 132/81 | HR 75 | Temp 97.2°F | Ht 64.0 in | Wt 192.5 lb

## 2020-01-01 DIAGNOSIS — F419 Anxiety disorder, unspecified: Secondary | ICD-10-CM | POA: Diagnosis not present

## 2020-01-01 DIAGNOSIS — G459 Transient cerebral ischemic attack, unspecified: Secondary | ICD-10-CM | POA: Insufficient documentation

## 2020-01-01 DIAGNOSIS — R9089 Other abnormal findings on diagnostic imaging of central nervous system: Secondary | ICD-10-CM

## 2020-01-01 HISTORY — DX: Other abnormal findings on diagnostic imaging of central nervous system: R90.89

## 2020-01-01 MED ORDER — DULOXETINE HCL 60 MG PO CPEP: 60.0000 mg | ORAL_CAPSULE | Freq: Every day | ORAL | 12 refills | Status: DC

## 2020-01-01 NOTE — Telephone Encounter (Signed)
Dr. Krista Blue you will have to put in a new patient referral for Cardiologist Dr. Bettina Gavia in [place] Telephone [phone]- fax [phone] doctors line [phone] .  In comments put patient needs a  I also ordered 30 days cardiac monitoring . Thanks Hinton Dyer

## 2020-01-01 NOTE — Telephone Encounter (Signed)
I was able to speak to the patient. She verbalized understanding of her MRI review. She is agreeable to have the LP and was informed that [facility] will be calling to set that up for her.

## 2020-01-01 NOTE — Telephone Encounter (Signed)
I called, and left a message for patient, I was able to find her imaging study under different last name Goad,  MRI of the brain showed no acute abnormality, but there is evidence of periventricular white matter changes, which has been stable compared to previous MRI in 2013, the morphology, and her clinical presentation raise the possibility of relapsing remitting multiple sclerosis,   I have ordered lumbar puncture,

## 2020-01-01 NOTE — Telephone Encounter (Signed)
Done

## 2020-01-01 NOTE — Telephone Encounter (Addendum)
[facility] record  Please hold off  cardiac monitoring for now

## 2020-01-01 NOTE — Addendum Note (Signed)
Addended by: Marcial Pacas on: 01/01/2020 04:23 PM   Modules accepted: Orders

## 2020-01-01 NOTE — Progress Notes (Addendum)
PATIENT: Joanne Reed DOB: 1970-11-10  Chief Complaint  Patient presents with  . Transient Ischemic Attack    Reports two events of right-arm numbness, garbled speech and chest pain. She has a third episode of left-sided cheek numbness. Symptoms have resolved with exception of having residual speech issues. She also reports having COVID-19 in 08/2019. After her diagnosis, she developed some shortness of breath that is still problematic.   Marland Kitchen PCP    Sl[place]ky, Marshall Cork., MD     HISTORICAL  Joanne Reed is a 49 year old female, seen in request by her primary care physician Dr. Cecille Amsterdam for evaluation of sudden onset right side difficulty, initial evaluation was on January 01, 2020.  I have reviewed and summarized the referring note from the referring physician.  She has past medical history of hyperlipidemia, diabetes, which was under suboptimal control, A1c in February 2019 was 9.0, she was not on any antiplatelet treatment before her hospital admission to [facility] on December 13, 2019.  She works as a International aid/development worker, while walking on the hallway on December 13, 2019, she had a sudden onset right side difficulty, described numbness involving right face, arm, leg, mild difficulty walking due to right leg weakness, difficulty raising right arm against gravity, slurred speech, she was admitted to [facility], I do not have records to review,  I was able to find her MRI imaging which is under last name Joanne Reed, Joanne Reed, I personally reviewed MRI of the brain, no acute abnormality, there was scattered periventricular white matter changes, perpendicular to ventricle, no significant change compared to previous scan in 2013, No evidence of acute intracranial abnormality.  MRA of brain and neck showed no significant large vessel disease,  Per patient, she also had normal echocardiogram, she was discharged with aspirin plus Plavix, complains of easy bruise,  She had long  history of depression, anxiety, previously was treated with Zoloft, Effexor without any benefit, she complains of worsening anxiety symptoms since she was discharged from hospital, frequent hyperventilation, fatigue, as if she is going into a panic attack.  She has been treated with clonazepam 0.5 mg twice a day as needed, which is helpful, her right arm weakness last about 30 minutes, right arm weakness last about 2 days, she continue has mild right leg heaviness, mild slurred speech, word finding difficulties.  From reviewing the record, she also had lumbar puncture in 2013, following her abnormal MRI of the brain done, but I do not have the report.  Patient was infected with COVID-19 in November 2020, she lost the taste, smell, had severe headache for about a week, but she denies fever, shortness of breath, CT of the chest in December 2020 showed no significant abnormality.  She now complains of frequent chest pain, happening a sitting position, lasting for couple hours, she felt difficulty breathing during the spells, denies heart palpitation  REVIEW OF SYSTEMS: Full 14 system review of systems performed and notable only for as above All other review of systems were negative.  ALLERGIES: No Known Allergies  HOME MEDICATIONS: Current Outpatient Medications  Medication Sig Dispense Refill  . aspirin EC 81 MG tablet Take 81 mg by mouth daily.    Marland Kitchen atorvastatin (LIPITOR) 20 MG tablet Take 20 mg by mouth daily.    . clonazePAM (KLONOPIN) 0.5 MG tablet Take 0.5 mg by mouth 3 (three) times daily as needed.    . clopidogrel (PLAVIX) 75 MG tablet Take 75 mg by mouth daily.    Marland Kitchen  glipiZIDE (GLUCOTROL) 5 MG tablet Take 5 mg by mouth 2 (two) times daily before a meal.    . metFORMIN (GLUCOPHAGE) 1000 MG tablet Take 500-1,000 mg by mouth in the morning, at noon, and at bedtime. Taking 1000mg  in am, 1000mg  midday, 500mg  in pm.     No current facility-administered medications for this visit.    PAST  MEDICAL HISTORY: Past Medical History:  Diagnosis Date  . Anxiety   . Diabetes mellitus without complication ([place])   . Hypercholesteremia   . Hyperlipidemia   . Migraine   . TIA (transient ischemic attack)     PAST SURGICAL HISTORY: Past Surgical History:  Procedure Laterality Date  . ABDOMINAL HYSTERECTOMY  2009   [facility]--Dr. Quincy Simmonds  . [place]  . cholecystectomy      FAMILY HISTORY: Family History  Problem Relation Age of Onset  . Kidney cancer Mother   . Skin cancer Mother        unsure of type - not melanoma - place removed from nose.  . Esophageal cancer Father   . Hypertension Brother   . Diabetes Paternal Grandmother     SOCIAL HISTORY: Social History   Socioeconomic History  . Marital status: Married    Spouse name: Not on file  . Number of children: 3  . Years of education: college  . Highest education level: Not on file  Occupational History  . Occupation: Pharmacist, hospital  Tobacco Use  . Smoking status: Former Research scientist (life sciences)  . Smokeless tobacco: Never Used  . Tobacco comment: 01/01/20 - quit 22 years ago  Substance and Sexual Activity  . Alcohol use: Yes    Comment: occasional  . Drug use: Never  . Sexual activity: Yes    Partners: Male    Birth control/protection: None, Surgical    Comment: [facility]  Other Topics Concern  . Not on file  Social History Narrative   Lives at home husband.   Right-handed.   Caffeine use: 20 oz of green tea daily.   Social Determinants of Health   Financial Resource Strain:   . Difficulty of Paying Living Expenses:   Food Insecurity:   . Worried About Charity fundraiser in the Last Year:   . Arboriculturist in the Last Year:   Transportation Needs:   . Film/video editor (Medical):   Marland Kitchen Lack of Transportation (Non-Medical):   Physical Activity:   . Days of Exercise per Week:   . Minutes of Exercise per Session:   Stress:   . Feeling of Stress :   Social Connections:   . Frequency of  Communication with Friends and Family:   . Frequency of Social Gatherings with Friends and Family:   . Attends Religious Services:   . Active Member of Clubs or Organizations:   . Attends Archivist Meetings:   Marland Kitchen Marital Status:   Intimate Partner Violence:   . Fear of Current or Ex-Partner:   . Emotionally Abused:   Marland Kitchen Physically Abused:   . Sexually Abused:      PHYSICAL EXAM   Vitals:   01/01/20 0815  BP: 132/81  Pulse: 75  Temp: (!) 97.2 F (36.2 C)  Weight: 192 lb 8 oz (87.3 kg)  Height: 5\' 4"  (1.626 m)    Not recorded      Body mass index is 33.04 kg/m.  PHYSICAL EXAMNIATION:  Gen: NAD, conversant, well nourised, well groomed  Cardiovascular: Regular rate rhythm, no peripheral edema, warm, nontender. Eyes: Conjunctivae clear without exudates or hemorrhage Neck: Supple, no carotid bruits. Pulmonary: Clear to auscultation bilaterally   NEUROLOGICAL EXAM:  MENTAL STATUS: Speech:    Speech is normal; fluent and spontaneous with normal comprehension.  Cognition:     Orientation to time, place and person     Normal recent and remote memory     Normal Attention span and concentration     Normal Language, naming, repeating,spontaneous speech     Fund of knowledge   CRANIAL NERVES: CN II: Visual fields are full to confrontation. Pupils are round equal and briskly reactive to light. CN III, IV, VI: extraocular movement are normal. No ptosis. CN V: Facial sensation is intact to light touch CN VII: Face is symmetric with normal eye closure  CN VIII: Hearing is normal to causal conversation. CN IX, X: Phonation is normal. CN XI: Head turning and shoulder shrug are intact  MOTOR: There is no pronator drift of out-stretched arms. Muscle bulk and tone are normal. Muscle strength is normal.  REFLEXES: Reflexes are 2+ and symmetric at the biceps, triceps, knees, and ankles. Plantar responses are flexor.  SENSORY: Intact to light  touch, pinprick and vibratory sensation are intact in fingers and toes.  COORDINATION: There is no trunk or limb dysmetria noted.  GAIT/STANCE: Posture is normal. Gait is steady with normal steps, base, arm swing, and turning. Heel and toe walking are normal. Tandem gait is normal.  Romberg is absent.   DIAGNOSTIC DATA (LABS, IMAGING, TESTING) - I reviewed patient records, labs, notes, testing and imaging myself where available.   ASSESSMENT AND PLAN  Joanne Reed is a 49 y.o. female   Sudden onset right arm, leg weakness, slurred speech on December 13, 2019,  MRI of the brain showed periventricular white matter changes, which has been present since 2013, the morphology raise the possibility of Relapsing Remitting Multiple Sclerosis, there is no evidence of positive DWI lesions,  She does has vascular risk factor of poorly controlled diabetes, hyperlipidemia,  Continue to control her vascular risk factor, aspirin 81 mg daily, stop Plavix 75 mg daily  Fluoroscopy guided lumbar puncture  Frequent chest pain, worsening anxiety  I will refer her to cardiac monitoring  Add on Cymbalta 60 mg daily  Marcial Pacas, M.D. Ph.D.  [facility] [address] [place], [place] [address] Ph: [phone] Fax: [phone]  CC: Enid Skeens., MD  Addendum: I was able to review [facility] health hospital admission from February 20 7 March 11/06/2019, hyperlipidemia, type 2 diabetes, anxiety, transient mild speech difficulty-on February 25, right arm weakness, blood pressure was 139/65, laboratory showed normal CBC, negative troponin, chest x-ray showed no active cardiopulmonary disease, CT head showed no acute abnormality, she was treated with aspirin, Plavix, evaluated by telemetry neurologist, [phone], suggested Plavix plus aspirin for 3 weeks, then aspirin 81 mg alone, MRI of the brain without contrast December 16, 2019, no acute abnormality, cerebral white matter  disease similar to previous MRI in November 2013, laboratories, hemoglobin 14.2, creatinine 0.5, normal TSH 1.64, free T4 1.25 EKG showed normal sinus rhythm echocardiogram ejection fraction 60 to 65%, no acute abnormality, left atrium was normal in size, negative bubble study, negative, MRA of the neck was negative

## 2020-01-02 ENCOUNTER — Other Ambulatory Visit: Payer: Self-pay | Admitting: Family Medicine

## 2020-01-02 NOTE — Telephone Encounter (Addendum)
Records provided to Dr. Krista Blue for review.

## 2020-01-09 ENCOUNTER — Ambulatory Visit
Admission: RE | Admit: 2020-01-09 | Discharge: 2020-01-09 | Disposition: A | Discharge status: Home / Self Care | Payer: BC Managed Care – PPO | Source: Ambulatory Visit | Attending: Neurology | Admitting: Neurology

## 2020-01-09 ENCOUNTER — Other Ambulatory Visit: Payer: Self-pay

## 2020-01-09 DIAGNOSIS — F419 Anxiety disorder, unspecified: Secondary | ICD-10-CM

## 2020-01-09 DIAGNOSIS — R9089 Other abnormal findings on diagnostic imaging of central nervous system: Secondary | ICD-10-CM

## 2020-01-09 DIAGNOSIS — G459 Transient cerebral ischemic attack, unspecified: Secondary | ICD-10-CM

## 2020-01-09 NOTE — Progress Notes (Signed)
Labs obtained from [facility] for LP labs by Sharyn Lull, Therapist, sports. Pt tolerated procedure well. Site is unremarkable.

## 2020-01-09 NOTE — Discharge Instructions (Signed)

## 2020-01-15 ENCOUNTER — Telehealth: Payer: Self-pay | Admitting: Neurology

## 2020-01-15 DIAGNOSIS — R9089 Other abnormal findings on diagnostic imaging of central nervous system: Secondary | ICD-10-CM

## 2020-01-15 NOTE — Telephone Encounter (Signed)
The patient called back and verbalized understanding of her CSF results.  She has not received a call from cardiology yet to schedule her cardiac monitoring.

## 2020-01-15 NOTE — Telephone Encounter (Addendum)
Left patient a detailed message, with results, on voicemail (ok per DPR).  Provided our number to call back with any questions.  I also let her know we would check on her cardiac monitoring status and to keep May follow up.

## 2020-01-15 NOTE — Telephone Encounter (Signed)
Please call patient, spinal fluid testing showed no significant abnormalities,  Please also check her ordered cardiac monitoring status  Keep follow-up visit in May

## 2020-01-15 NOTE — Telephone Encounter (Signed)
Sharyn Lull I see a order for Cardio But it looks like it was CX. Please Help thanks Hinton Dyer

## 2020-02-05 NOTE — Telephone Encounter (Signed)
Order placed for cardiac monitoring

## 2020-02-05 NOTE — Telephone Encounter (Signed)
Pt called to check on her referral  States she has not heard back about scheduling. Pt states a VM can be left

## 2020-02-05 NOTE — Telephone Encounter (Signed)
Joanne Reed Dr. Krista Blue say's in her note she wanted Cardiac Monitoring  . Will you Please order I will get her set up for apt in [place] thanks [place]

## 2020-02-05 NOTE — Telephone Encounter (Signed)
Called patient and left her a message asking her to call me back .  

## 2020-02-05 NOTE — Addendum Note (Signed)
Addended by: Marcial Pacas on: 02/05/2020 06:24 PM   Modules accepted: Orders

## 2020-02-06 NOTE — Telephone Encounter (Signed)
Order faxed and patient is aware. Patient has telephone as well.

## 2020-02-07 LAB — GRAM STAIN
MICRO NUMBER:: 10292139
SPECIMEN QUALITY:: ADEQUATE

## 2020-02-07 LAB — MULTIPLE SCLEROSIS PANEL 2
Albumin Serum: 3.2 g/dL — ABNORMAL LOW (ref 3.5–5.2)
Albumin, CSF: 13.8 mg/dL (ref 8.0–42.0)
CNS-IgG Synthesis Rate: -2.1 mg/24 h (ref <=3.3)
IgG (Immunoglobin G), Serum: 751 mg/dL (ref 600–1640)
IgG Total CSF: 1.6 mg/dL (ref 0.8–7.7)
IgG-Index: 0.49 (ref <0.66)
Myelin Basic Protein: <2 mcg/L (ref 2.0–4.0)

## 2020-02-07 LAB — FUNGUS CULTURE W SMEAR
CULTURE:: NO GROWTH
MICRO NUMBER:: 10292140
SMEAR:: NONE SEEN
SPECIMEN QUALITY:: ADEQUATE

## 2020-02-07 LAB — GLUCOSE, CSF: Glucose, CSF: 57 mg/dL (ref 40–80)

## 2020-02-07 LAB — CSF CELL COUNT WITH DIFFERENTIAL
RBC Count, CSF: 100 cells/uL — ABNORMAL HIGH
WBC, CSF: 0 cells/uL (ref 0–5)

## 2020-02-07 LAB — PROTEIN, CSF: Total Protein, CSF: 27 mg/dL (ref 15–45)

## 2020-02-07 LAB — VDRL, CSF: VDRL Quant, CSF: NONREACTIVE

## 2020-02-08 NOTE — Progress Notes (Signed)
Cardiology Office Note:    Date:  02/10/2020   ID:  Joanne Reed, DOB [birth year], MRN [id number]  PCP:  Joanne Reed., MD  Cardiologist:  Shirlee More, MD   Referring MD: Joanne Reed., MD  ASSESSMENT:    1. TIA (transient ischemic attack)   2. Palpitations    PLAN:    In order of problems listed above:  1. Typical TIA no discrete etiology has been found at risk for atrial fibrillation with diabetes week ambulatory heart rhythm monitor if unremarkable set up for outpatient TEE if unremarkable I think implanted loop recorder would be appropriate.  She is presently on single antiplatelet agent with aspirin. 2. See above 2-week monitor 3. Very atypical nonanginal chest pain she prefers not to do an ischemia evaluation I think that is reasonable 4. Able diabetes continue current medical treatment 5. Stable hyperlipidemia continue her statin  Next appointment 6 weeks   Medication Adjustments/Labs and Tests Ordered: Current medicines are reviewed at length with the patient today.  Concerns regarding medicines are outlined above.  Orders Placed This Encounter  Procedures  . LONG TERM MONITOR (3-14 DAYS)  . EKG 12-Lead   No orders of the defined types were placed in this encounter.    Chief Complaint  Patient presents with  . Transient Ischemic Attack    By neurology heart rhythm monitor    History of Present Illness:    Joanne Reed is a 49 y.o. female who is being seen today for the evaluation of cardiac rhythm by neurology [name] at the request of Joanne Reed., MD. She has a history of type 2 diabetes and hyperlipidemia and COVID-19 in November 2020.  Neurology consultation details that echocardiogram done at [facility] March 2021 showed sinus rhythm normal left ventricular size and function ejection fraction 60 to 65% and a negative contrast echo for shunt.. She was admitted to [facility] 12/13/2019 with  right-sided weakness and speech disturbance.  On review of consultation by neurology that she had reviewed the MRI with no acute changes scattered periventricular white matter changes unchanged from 2013.  There is a notation she had an echocardiogram requested from [facility] reviewed 04/11/2020 chest x-ray normal CT of the head read as no acute abnormality EKG sinus rhythm normal echocardiogram showed EF of 60 to [id number] normal diastolic function left and right atrium are normal in size and there is no significant valvular abnormality present.  She had contrast injection performed without shunt.  MRI of the brain was negative for cerebral aneurysm normal aortic arch great vessels carotid bifurcation and patent vertebral arteries.  MRI of the brain was unchanged from previous in 2013 differential diagnosis included demyelinating disease unusual small vessel ischemic disease or prior infectious inflammatory process.  The impression of teleneurology was possible TIA.  She tells me she still is not back to normal still struggles to find the right word I am unsure whether she is having aphasia or dysarthria and is unsteady in her gait.  She has had some palpitation not severe sustained and has had no known heart disease or arrhythmia.  She is a vigorous active woman she exercises daily and has no exertional chest pain shortness of breath or syncope.  At times she gets brief momentary point localized sharp chest pain not anginal in nature.  We discussed doing an ischemia evaluation and she prefers to defer while she is evaluated for cardiac source  of TIA.  We will use a 2-week ambulatory heart rhythm monitor and if that does not show atrial fibrillation we will set her up for an outpatient transesophageal echocardiogram and if that is unremarkable I think she should have an implanted loop recorder.  She was on aspirin and clopidogrel and the latter was stopped due to bruising.  Her  initial MRI in 2013 was for migraines.  She tells me since she is seen by neurology she had no other lumbar puncture and does not have multiple sclerosis Past Medical History:  Diagnosis Date  . Anxiety   . Diabetes mellitus without complication ([place])   . Hypercholesteremia   . Hyperlipidemia   . Migraine   . TIA (transient ischemic attack)     Past Surgical History:  Procedure Laterality Date  . ABDOMINAL HYSTERECTOMY  2009   R-TLH--Dr. Quincy Simmonds  . [place]  . cholecystectomy      Current Medications: Current Meds  Medication Sig  . aspirin EC 81 MG tablet Take 81 mg by mouth daily.  Marland Kitchen atorvastatin (LIPITOR) 20 MG tablet Take 20 mg by mouth daily.  . DULoxetine (CYMBALTA) 60 MG capsule Take 1 capsule (60 mg total) by mouth daily.  Marland Kitchen glipiZIDE (GLUCOTROL) 5 MG tablet Take 5 mg by mouth 2 (two) times daily before a meal.  . metFORMIN (GLUCOPHAGE-XR) 500 MG 24 hr tablet Take 500 mg by mouth 5 (five) times daily.     Allergies:   Patient has no known allergies.   Social History   Socioeconomic History  . Marital status: Married    Spouse name: Not on file  . Number of children: 3  . Years of education: college  . Highest education level: Not on file  Occupational History  . Occupation: Pharmacist, hospital  Tobacco Use  . Smoking status: Former Research scientist (life sciences)  . Smokeless tobacco: Never Used  . Tobacco comment: 01/01/20 - quit 22 years ago  Substance and Sexual Activity  . Alcohol use: Yes    Comment: occasional  . Drug use: Never  . Sexual activity: Yes    Partners: Male    Birth control/protection: None, Surgical    Comment: R-TLH  Other Topics Concern  . Not on file  Social History Narrative   Lives at home husband.   Right-handed.   Caffeine use: 20 oz of green tea daily.   Social Determinants of Health   Financial Resource Strain:   . Difficulty of Paying Living Expenses:   Food Insecurity:   . Worried About Charity fundraiser in the Last Year:     . Arboriculturist in the Last Year:   Transportation Needs:   . Film/video editor (Medical):   Marland Kitchen Lack of Transportation (Non-Medical):   Physical Activity:   . Days of Exercise per Week:   . Minutes of Exercise per Session:   Stress:   . Feeling of Stress :   Social Connections:   . Frequency of Communication with Friends and Family:   . Frequency of Social Gatherings with Friends and Family:   . Attends Religious Services:   . Active Member of Clubs or Organizations:   . Attends Archivist Meetings:   Marland Kitchen Marital Status:      Family History: The patient's family history includes Diabetes in her paternal grandmother; Esophageal cancer in her father; Hypertension in her brother; Kidney cancer in her mother; Skin cancer in her mother.  ROS:   Review  of Systems  Constitution: Negative.  HENT: Negative.   Eyes: Negative.   Cardiovascular: Positive for chest pain and palpitations.  Respiratory: Negative.   Endocrine: Negative.   Hematologic/Lymphatic: Negative.   Skin: Negative.   Musculoskeletal: Negative.   Gastrointestinal: Negative.   Genitourinary: Negative.   Neurological: Negative.   Psychiatric/Behavioral: Negative.   Allergic/Immunologic: Negative.    Please see the history of present illness.     All other systems reviewed and are negative.  EKGs/Labs/Other Studies Reviewed:    The following studies were reviewed today:   EKG:  EKG is  ordered today.  The ekg ordered today is personally reviewed and demonstrates and is normal  Recent Labs: 07/30/2019: ALT 23; BUN 16; Creatinine, Ser 0.63; Hemoglobin 15.0; Platelets 371; Potassium 4.2; Sodium 140  Recent Lipid Panel No results found for: CHOL, TRIG, HDL, CHOLHDL, VLDL, LDLCALC, LDLDIRECT  Physical Exam:    VS:  BP 128/80   Pulse 75   Temp (!) 97.3 F (36.3 C)   Ht 5\' 4"  (1.626 m)   Wt 187 lb 3.2 oz (84.9 kg)   SpO2 98%   BMI 32.13 kg/m     Wt Readings from Last 3 Encounters:   02/10/20 187 lb 3.2 oz (84.9 kg)  01/01/20 192 lb 8 oz (87.3 kg)  09/26/14 211 lb (95.7 kg)     GEN: Overweight well nourished, well developed in no acute distress HEENT: Normal NECK: No JVD; No carotid bruits LYMPHATICS: No lymphadenopathy CARDIAC: RRR, no murmurs, rubs, gallops RESPIRATORY:  Clear to auscultation without rales, wheezing or rhonchi  ABDOMEN: Soft, non-tender, non-distended MUSCULOSKELETAL:  No edema; No deformity  SKIN: Warm and dry NEUROLOGIC:  Alert and oriented x 3 PSYCHIATRIC:  Normal affect     Signed, Shirlee More, MD  02/10/2020 10:30 AM    [place]

## 2020-02-10 ENCOUNTER — Ambulatory Visit: Payer: BC Managed Care – PPO | Admitting: Cardiology

## 2020-02-10 ENCOUNTER — Encounter: Payer: Self-pay | Admitting: Cardiology

## 2020-02-10 ENCOUNTER — Ambulatory Visit (INDEPENDENT_AMBULATORY_CARE_PROVIDER_SITE_OTHER): Payer: BC Managed Care – PPO

## 2020-02-10 ENCOUNTER — Other Ambulatory Visit: Payer: Self-pay

## 2020-02-10 VITALS — BP 128/80 | HR 75 | Temp 97.3°F | Ht 64.0 in | Wt 187.2 lb

## 2020-02-10 DIAGNOSIS — G459 Transient cerebral ischemic attack, unspecified: Secondary | ICD-10-CM

## 2020-02-10 DIAGNOSIS — R002 Palpitations: Secondary | ICD-10-CM

## 2020-02-10 NOTE — Patient Instructions (Signed)
Medication Instructions:  Your physician recommends that you continue on your current medications as directed. Please refer to the Current Medication list given to you today.  *If you need a refill on your cardiac medications before your next appointment, please call your pharmacy*   Lab Work: None If you have labs (blood work) drawn today and your tests are completely normal, you will receive your results only by: . MyChart Message (if you have MyChart) OR . A paper copy in the mail If you have any lab test that is abnormal or we need to change your treatment, we will call you to review the results.   Testing/Procedures: A zio monitor was ordered today. It will remain on for 14 days. You will then return monitor and event diary in provided box. It takes 1-2 weeks for report to be downloaded and returned to us. We will call you with the results. If monitor falls off or has orange flashing light, please call Zio for further instructions.      Follow-Up: At [facility], you and your health needs are our priority.  As part of our continuing mission to provide you with exceptional heart care, we have created designated Provider Care Teams.  These Care Teams include your primary Cardiologist (physician) and Advanced Practice Providers (APPs -  Physician Assistants and Nurse Practitioners) who all work together to provide you with the care you need, when you need it.  We recommend signing up for the patient portal called "MyChart".  Sign up information is provided on this After Visit Summary.  MyChart is used to connect with patients for Virtual Visits (Telemedicine).  Patients are able to view lab/test results, encounter notes, upcoming appointments, etc.  Non-urgent messages can be sent to your provider as well.   To learn more about what you can do with MyChart, go to [email or web address].    Your next appointment:   6 week(s)  The format for your next appointment:   In  Person  Provider:   Brian Munley, MD   Other Instructions   

## 2020-02-26 ENCOUNTER — Telehealth: Payer: Self-pay | Admitting: Neurology

## 2020-02-26 NOTE — Telephone Encounter (Signed)
Pt is asking if Dr Krista Blue still wants to see her on Monday even if she hasnt received the results from her Heart monitor yet.  Please call

## 2020-02-26 NOTE — Telephone Encounter (Signed)
I reached out pt. She sts she is scheduled to get her HR monitor results back on 03/24/2020 and would like to push her appt with Dr. Krista Blue out until after she has her appt with cardiology.  I have rescheduled her appt for 03/30/2020 at 800 am.

## 2020-03-02 ENCOUNTER — Ambulatory Visit: Payer: BC Managed Care – PPO | Admitting: Neurology

## 2020-03-23 NOTE — Progress Notes (Signed)
Cardiology Office Note:    Date:  03/24/2020   ID:  Joanne Reed, DOB May 22, 1971, MRN [id number]  PCP:  Enid Skeens., MD  Cardiologist:  Shirlee More, MD    Referring MD: Enid Skeens., MD    ASSESSMENT:    1. TIA (transient ischemic attack)   2. Hyperlipidemia, unspecified hyperlipidemia type   3. Type 2 diabetes mellitus with other circulatory complication, without long-term current use of insulin (HCC)    PLAN:    In order of problems listed above:  1. For further evaluation referred for an implanted loop recorder 2. Check CMP lipid profile goal LDL less than 70 continue high intensity statin 3. Stable diabetes managed by her PCP   Next appointment: 3 months   Medication Adjustments/Labs and Tests Ordered: Current medicines are reviewed at length with the patient today.  Concerns regarding medicines are outlined above.  Orders Placed This Encounter  Procedures  . Comp Met (CMET)  . Lipid Profile  . Ambulatory referral to Cardiac Electrophysiology   No orders of the defined types were placed in this encounter.   Chief Complaint  Patient presents with  . Follow-up    After typical TIA event monitor    History of Present Illness:    Joanne Reed is a 49 y.o. female with a hx of type 2 diabetes mellitus hyperlipidemia and COVID-19 in November 2020 was last seen 02/10/2020 after sustaining a TIA.  Her 14-day event monitor showed no arrhythmia.  [facility] reviewed 04/11/2020 chest x-ray normal CT of the head read as no acute abnormality EKG sinus rhythm normal echocardiogram showed EF of 60 to 57% normal diastolic function left and right atrium are normal in size and there is no significant valvular abnormality present.  She had contrast injection performed without shunt.  MRI of the brain was negative for cerebral aneurysm normal aortic arch great vessels carotid bifurcation and patent vertebral arteries.  MRI of the brain was unchanged from  previous in 2013 differential diagnosis included demyelinating disease unusual small vessel ischemic disease or prior infectious inflammatory process.  The impression of teleneurology was possible TIA.  Compliance with diet, lifestyle and medications: Yes   Opportunity to review the results of her monitor we did not document atrial fibrillation.  She is at increased risk with female sex diabetes and her whole clinical scenario.  I told her I think she would benefit from an implanted loop recorder and if we document atrial fibrillation I will change her treatment with the initiation of anticoagulation.  She agrees and understands will be referred to EP.  She has had no recurrent neurologic event chest pain shortness of breath palpitation or syncope.  She tolerates her statin without muscle pain or weakness.  Study Highlights A ZIO monitor was performed for 13 days and 23 hours assess palpitation beginning 02/10/2020. The predominant rhythm was sinus with average minimum maximum heart rates of 72, 44 and 153 bpm. There were no pauses of 3 seconds or greater and no episodes of AV nodal block or sinus node exit block. Ventricular ectopy was rare with isolated PVCs Supraventricular ectopy is rare with isolated APCs.  There were no episodes of atrial fibrillation flutter or SVT. There were 9 triggered events all sinus rhythm and no arrhythmia.  Conclusion, normal 14-day event monitor, triggered events are unassociated with arrhythmia.    Past Medical History:  Diagnosis Date  . Abnormal finding on MRI of brain 01/01/2020  . Anxiety   .  Diabetes mellitus without complication ([place])   . Hypercholesteremia   . Hyperlipidemia   . Migraine   . Thyromegaly 09/20/2013  . TIA (transient ischemic attack)   . Uncontrolled type 2 diabetes mellitus with diabetic polyneuropathy, without long-term current use of insulin ([place]) 07/10/2017    Past Surgical History:  Procedure Laterality Date  . ABDOMINAL  HYSTERECTOMY  2009   R-TLH--Dr. Quincy Simmonds  . [place]  . cholecystectomy      Current Medications: Current Meds  Medication Sig  . aspirin EC 81 MG tablet Take 81 mg by mouth daily.  Marland Kitchen atorvastatin (LIPITOR) 20 MG tablet Take 20 mg by mouth daily.  . DULoxetine (CYMBALTA) 60 MG capsule Take 1 capsule (60 mg total) by mouth daily.  Marland Kitchen GLIPIZIDE XL 5 MG 24 hr tablet Take 5 mg by mouth 2 (two) times daily.  . metFORMIN (GLUCOPHAGE-XR) 500 MG 24 hr tablet Take 500 mg by mouth 5 (five) times daily.     Allergies:   Patient has no known allergies.   Social History   Socioeconomic History  . Marital status: Married    Spouse name: Not on file  . Number of children: 3  . Years of education: college  . Highest education level: Not on file  Occupational History  . Occupation: Pharmacist, hospital  Tobacco Use  . Smoking status: Former Research scientist (life sciences)  . Smokeless tobacco: Never Used  . Tobacco comment: 01/01/20 - quit 22 years ago  Substance and Sexual Activity  . Alcohol use: Yes    Comment: occasional  . Drug use: Never  . Sexual activity: Yes    Partners: Male    Birth control/protection: None, Surgical    Comment: R-TLH  Other Topics Concern  . Not on file  Social History Narrative   Lives at home husband.   Right-handed.   Caffeine use: 20 oz of green tea daily.   Social Determinants of Health   Financial Resource Strain:   . Difficulty of Paying Living Expenses:   Food Insecurity:   . Worried About Charity fundraiser in the Last Year:   . Arboriculturist in the Last Year:   Transportation Needs:   . Film/video editor (Medical):   Marland Kitchen Lack of Transportation (Non-Medical):   Physical Activity:   . Days of Exercise per Week:   . Minutes of Exercise per Session:   Stress:   . Feeling of Stress :   Social Connections:   . Frequency of Communication with Friends and Family:   . Frequency of Social Gatherings with Friends and Family:   . Attends Religious  Services:   . Active Member of Clubs or Organizations:   . Attends Archivist Meetings:   Marland Kitchen Marital Status:      Family History: The patient's family history includes Diabetes in her paternal grandmother; Esophageal cancer in her father; Hypertension in her brother; Kidney cancer in her mother; Skin cancer in her mother. ROS:   Please see the history of present illness.    All other systems reviewed and are negative.  EKGs/Labs/Other Studies Reviewed:    The following studies were reviewed today:    Recent Labs: 07/30/2019: ALT 23; BUN 16; Creatinine, Ser 0.63; Hemoglobin 15.0; Platelets 371; Potassium 4.2; Sodium 140  Recent Lipid Panel No results found for: CHOL, TRIG, HDL, CHOLHDL, VLDL, LDLCALC, LDLDIRECT  Physical Exam:    VS:  BP 118/68   Pulse 86   Ht 5'  4" (1.626 m)   Wt 194 lb (88 kg)   SpO2 96%   BMI 33.30 kg/m     Wt Readings from Last 3 Encounters:  03/24/20 194 lb (88 kg)  02/10/20 187 lb 3.2 oz (84.9 kg)  01/01/20 192 lb 8 oz (87.3 kg)     GEN:  Well nourished, well developed in no acute distress HEENT: Normal NECK: No JVD; No carotid bruits LYMPHATICS: No lymphadenopathy CARDIAC: RRR, no murmurs, rubs, gallops RESPIRATORY:  Clear to auscultation without rales, wheezing or rhonchi  ABDOMEN: Soft, non-tender, non-distended MUSCULOSKELETAL:  No edema; No deformity  SKIN: Warm and dry NEUROLOGIC:  Alert and oriented x 3 PSYCHIATRIC:  Normal affect    Signed, Shirlee More, MD  03/24/2020 10:37 AM    [place]

## 2020-03-24 ENCOUNTER — Encounter: Payer: Self-pay | Admitting: Cardiology

## 2020-03-24 ENCOUNTER — Ambulatory Visit: Payer: BC Managed Care – PPO | Admitting: Cardiology

## 2020-03-24 ENCOUNTER — Other Ambulatory Visit: Payer: Self-pay

## 2020-03-24 VITALS — BP 118/68 | HR 86 | Ht 64.0 in | Wt 194.0 lb

## 2020-03-24 DIAGNOSIS — E1159 Type 2 diabetes mellitus with other circulatory complications: Secondary | ICD-10-CM | POA: Diagnosis not present

## 2020-03-24 DIAGNOSIS — G459 Transient cerebral ischemic attack, unspecified: Secondary | ICD-10-CM

## 2020-03-24 DIAGNOSIS — E785 Hyperlipidemia, unspecified: Secondary | ICD-10-CM | POA: Diagnosis not present

## 2020-03-24 LAB — COMPREHENSIVE METABOLIC PANEL
ALT: 45 IU/L — ABNORMAL HIGH (ref 0–32)
AST: 33 IU/L (ref 0–40)
Albumin/Globulin Ratio: 2 (ref 1.2–2.2)
Albumin: 4.3 g/dL (ref 3.8–4.8)
Alkaline Phosphatase: 112 IU/L (ref 48–121)
BUN/Creatinine Ratio: 16 (ref 9–23)
BUN: 10 mg/dL (ref 6–24)
Bilirubin Total: 0.4 mg/dL (ref 0.0–1.2)
CO2: 26 mmol/L (ref 20–29)
Calcium: 9.6 mg/dL (ref 8.7–10.2)
Chloride: 103 mmol/L (ref 96–106)
Creatinine, Ser: 0.64 mg/dL (ref 0.57–1.00)
GFR calc Af Amer: 121 mL/min/{1.73_m2} (ref >59)
GFR calc non Af Amer: 105 mL/min/{1.73_m2} (ref >59)
Globulin, Total: 2.2 g/dL (ref 1.5–4.5)
Glucose: 111 mg/dL — ABNORMAL HIGH (ref 65–99)
Potassium: 4.7 mmol/L (ref 3.5–5.2)
Sodium: 142 mmol/L (ref 134–144)
Total Protein: 6.5 g/dL (ref 6.0–8.5)

## 2020-03-24 LAB — LIPID PANEL
Chol/HDL Ratio: 2.2 ratio (ref 0.0–4.4)
Cholesterol, Total: 145 mg/dL (ref 100–199)
HDL: 65 mg/dL (ref >39)
LDL Chol Calc (NIH): 64 mg/dL (ref 0–99)
Triglycerides: 83 mg/dL (ref 0–149)
VLDL Cholesterol Cal: 16 mg/dL (ref 5–40)

## 2020-03-24 NOTE — Patient Instructions (Signed)
Medication Instructions:  Your physician recommends that you continue on your current medications as directed. Please refer to the Current Medication list given to you today.  *If you need a refill on your cardiac medications before your next appointment, please call your pharmacy*   Lab Work: Your physician recommends that you return for lab work in: [place], Lipids If you have labs (blood work) drawn today and your tests are completely normal, you will receive your results only by: Marland Kitchen MyChart Message (if you have MyChart) OR . A paper copy in the mail If you have any lab test that is abnormal or we need to change your treatment, we will call you to review the results.   Testing/Procedures: None   Follow-Up: At [facility], you and your health needs are our priority.  As part of our continuing mission to provide you with exceptional heart care, we have created designated Provider Care Teams.  These Care Teams include your primary Cardiologist (physician) and Advanced Practice Providers (APPs -  Physician Assistants and Nurse Practitioners) who all work together to provide you with the care you need, when you need it.  We recommend signing up for the patient portal called "MyChart".  Sign up information is provided on this After Visit Summary.  MyChart is used to connect with patients for Virtual Visits (Telemedicine).  Patients are able to view lab/test results, encounter notes, upcoming appointments, etc.  Non-urgent messages can be sent to your provider as well.   To learn more about what you can do with MyChart, go to [email or web address].    Your next appointment:   3 month(s)  The format for your next appointment:   In Person  Provider:   Shirlee More, MD   Other Instructions We have put in a referral for you to see an Electrophysiology Dr. Deborha Payment will call you to schedule this appointment.

## 2020-03-30 ENCOUNTER — Ambulatory Visit: Payer: BC Managed Care – PPO | Admitting: Neurology

## 2020-03-30 ENCOUNTER — Encounter: Payer: Self-pay | Admitting: Neurology

## 2020-03-30 ENCOUNTER — Telehealth: Payer: Self-pay | Admitting: Neurology

## 2020-03-30 ENCOUNTER — Other Ambulatory Visit: Payer: Self-pay

## 2020-03-30 VITALS — BP 122/62 | HR 72 | Ht 64.0 in | Wt 192.5 lb

## 2020-03-30 DIAGNOSIS — G5603 Carpal tunnel syndrome, bilateral upper limbs: Secondary | ICD-10-CM

## 2020-03-30 DIAGNOSIS — G35 Multiple sclerosis: Secondary | ICD-10-CM

## 2020-03-30 DIAGNOSIS — R202 Paresthesia of skin: Secondary | ICD-10-CM | POA: Diagnosis not present

## 2020-03-30 MED ORDER — ALPRAZOLAM 1 MG PO TABS: ORAL_TABLET | ORAL | 0 refills | Status: DC

## 2020-03-30 NOTE — Patient Instructions (Signed)
[email or web address]  Pills  Aubagio (teriflunomide)  Dimethyl Fumarate (dimethyl fumarate - generic equivalent of Tecfidera)  Gilenya (fingolimod)--similar Mayzent (siponimod)  injection  Ocrevus (ocrelizumab) Tysabri (natalizumab)

## 2020-03-30 NOTE — Progress Notes (Signed)
PATIENT: Joanne Reed DOB: [birth year]  Chief Complaint  Patient presents with  . Review Test Results    She is here to review her MRI, LP and heart monitoring results.     HISTORICAL  Joanne Reed is a 49 year old female, seen in request by her primary care physician Dr. Cecille Reed for evaluation of sudden onset right side difficulty, initial evaluation was on January 01, 2020.  I have reviewed and summarized the referring note from the referring physician.  She has past medical history of hyperlipidemia, diabetes, which was under suboptimal control, A1c in February 2019 was 9.0, she was not on any antiplatelet treatment before her hospital admission to [facility] on December 13, 2019.  She works as a International aid/development worker, while walking on the hallway on December 13, 2019, she had a sudden onset right side difficulty, described numbness involving right face, arm, leg, mild difficulty walking due to right leg weakness, difficulty raising right arm against gravity, slurred speech, she was admitted to [facility], I do not have records to review,  I was able to find her MRI imaging which is under last name Joanne, Reed, I personally reviewed MRI of the brain, no acute abnormality, there was scattered periventricular white matter changes, perpendicular to ventricle, no significant change compared to previous scan in 2013, No evidence of acute intracranial abnormality.  MRA of brain and neck showed no significant large vessel disease,  Per patient, she also had normal echocardiogram, she was discharged with aspirin plus Plavix, complains of easy bruise,  She had long history of depression, anxiety, previously was treated with Zoloft, Effexor without any benefit, she complains of worsening anxiety symptoms since she was discharged from hospital, frequent hyperventilation, fatigue, as if she is going into a panic attack.  She has been treated with clonazepam 0.5 mg twice a  day as needed, which is helpful, her right arm weakness last about 30 minutes, right arm weakness last about 2 days, she continue has mild right leg heaviness, mild slurred speech, word finding difficulties.  From reviewing the record, she also had lumbar puncture in 2013, following her abnormal MRI of the brain done, but I do not have the report.  Patient was infected with COVID-19 in November 2020, she lost the taste, smell, had severe headache for about a week, but she denies fever, shortness of breath, CT of the chest in December 2020 showed no significant abnormality.  She now complains of frequent chest pain, happening a sitting position, lasting for couple hours, she felt difficulty breathing during the spells, denies heart palpitation  UPDATE March 30 2020: She complains of mild gait abnormality worsening urinary urgency  I was able to review [facility] health hospital admission from February 20 7 March 11/06/2019, hyperlipidemia, type 2 diabetes, anxiety, transient mild speech difficulty-on February 25, right arm weakness, blood pressure was 139/65, laboratory showed normal CBC, negative troponin, chest x-ray showed no active cardiopulmonary disease, CT head showed no acute abnormality, she was treated with aspirin, Plavix, evaluated by telemetry neurologist, A1c6.4, suggested Plavix plus aspirin for 3 weeks, then aspirin 81 mg alone,   I personally reviewed MRI of the brain without contrast December 16, 2019, no acute abnormality, cerebral white matter disease similar to previous MRI in November 2013, location and morphology most consistent with multiple sclerosis  Spinal tap March 2021 showed more than 5 oligoclonal band  laboratories, hemoglobin 14.2, creatinine 0.5, normal TSH 1.64, free T4 1.25 EKG showed normal sinus  rhythm echocardiogram ejection fraction 60 to 65%, no acute abnormality, left atrium was normal in size, negative bubble study, negative, MRA of the neck was negative  REVIEW  OF SYSTEMS: Full 14 system review of systems performed and notable only for as above All other review of systems were negative.  ALLERGIES: No Known Allergies  HOME MEDICATIONS: Current Outpatient Medications  Medication Sig Dispense Refill  . aspirin EC 81 MG tablet Take 81 mg by mouth daily.    Marland Kitchen atorvastatin (LIPITOR) 20 MG tablet Take 20 mg by mouth daily.    . DULoxetine (CYMBALTA) 60 MG capsule Take 1 capsule (60 mg total) by mouth daily. 30 capsule 12  . GLIPIZIDE XL 5 MG 24 hr tablet Take 5 mg by mouth 2 (two) times daily.    . metFORMIN (GLUCOPHAGE-XR) 500 MG 24 hr tablet Take 500 mg by mouth 5 (five) times daily.     No current facility-administered medications for this visit.    PAST MEDICAL HISTORY: Past Medical History:  Diagnosis Date  . Abnormal finding on MRI of brain 01/01/2020  . Anxiety   . Diabetes mellitus without complication ([place])   . Hypercholesteremia   . Hyperlipidemia   . Migraine   . Thyromegaly 09/20/2013  . TIA (transient ischemic attack)   . Uncontrolled type 2 diabetes mellitus with diabetic polyneuropathy, without long-term current use of insulin ([place]) 07/10/2017    PAST SURGICAL HISTORY: Past Surgical History:  Procedure Laterality Date  . ABDOMINAL HYSTERECTOMY  2009   [facility]--Dr. Quincy Simmonds  . [place]  . cholecystectomy      FAMILY HISTORY: Family History  Problem Relation Age of Onset  . Kidney cancer Mother   . Skin cancer Mother        unsure of type - not melanoma - place removed from nose.  . Esophageal cancer Father   . Hypertension Brother   . Diabetes Paternal Grandmother     SOCIAL HISTORY: Social History   Socioeconomic History  . Marital status: Married    Spouse name: Not on file  . Number of children: 3  . Years of education: college  . Highest education level: Not on file  Occupational History  . Occupation: Pharmacist, hospital  Tobacco Use  . Smoking status: Former Research scientist (life sciences)  . Smokeless tobacco:  Never Used  . Tobacco comment: 01/01/20 - quit 22 years ago  Substance and Sexual Activity  . Alcohol use: Yes    Comment: occasional  . Drug use: Never  . Sexual activity: Yes    Partners: Male    Birth control/protection: None, Surgical    Comment: [facility]  Other Topics Concern  . Not on file  Social History Narrative   Lives at home husband.   Right-handed.   Caffeine use: 20 oz of green tea daily.   Social Determinants of Health   Financial Resource Strain:   . Difficulty of Paying Living Expenses:   Food Insecurity:   . Worried About Charity fundraiser in the Last Year:   . Arboriculturist in the Last Year:   Transportation Needs:   . Film/video editor (Medical):   Marland Kitchen Lack of Transportation (Non-Medical):   Physical Activity:   . Days of Exercise per Week:   . Minutes of Exercise per Session:   Stress:   . Feeling of Stress :   Social Connections:   . Frequency of Communication with Friends and Family:   . Frequency of Social  Gatherings with Friends and Family:   . Attends Religious Services:   . Active Member of Clubs or Organizations:   . Attends Archivist Meetings:   Marland Kitchen Marital Status:   Intimate Partner Violence:   . Fear of Current or Ex-Partner:   . Emotionally Abused:   Marland Kitchen Physically Abused:   . Sexually Abused:      PHYSICAL EXAM   Vitals:   03/30/20 0805  BP: 122/62  Pulse: 72  Weight: 192 lb 8 oz (87.3 kg)  Height: 5\' 4"  (1.626 m)   Not recorded     Body mass index is 33.04 kg/m.  PHYSICAL EXAMNIATION:  Gen: NAD, conversant, well nourised, well groomed                     Cardiovascular: Regular rate rhythm, no peripheral edema, warm, nontender. Eyes: Conjunctivae clear without exudates or hemorrhage Neck: Supple, no carotid bruits. Pulmonary: Clear to auscultation bilaterally   NEUROLOGICAL EXAM:  MENTAL STATUS: Speech:    Speech is normal; fluent and spontaneous with normal comprehension.  Cognition:      Orientation to time, place and person     Normal recent and remote memory     Normal Attention span and concentration     Normal Language, naming, repeating,spontaneous speech     Fund of knowledge   CRANIAL NERVES: CN II: Visual fields are full to confrontation. Pupils are round equal and briskly reactive to light. CN III, IV, VI: extraocular movement are normal. No ptosis. CN V: Facial sensation is intact to light touch CN VII: Face is symmetric with normal eye closure  CN VIII: Hearing is normal to causal conversation. CN IX, X: Phonation is normal. CN XI: Head turning and shoulder shrug are intact  MOTOR: There is no pronator drift of out-stretched arms. Muscle bulk and tone are normal. Muscle strength is normal.  REFLEXES: Reflexes are 2+ and symmetric at the biceps, triceps, knees, and ankles. Plantar responses are flexor.  SENSORY: Intact to light touch, pinprick and vibratory sensation are intact in fingers and toes.  COORDINATION: There is no trunk or limb dysmetria noted.  GAIT/STANCE: Steady, cautious  DIAGNOSTIC DATA (LABS, IMAGING, TESTING) - I reviewed patient records, labs, notes, testing and imaging myself where available.   ASSESSMENT AND PLAN  Joanne Reed is a 49 y.o. female   Relapsing remitting multiple sclerosis  MRI of the brain showed periventricular white matter changes, which has been present since 2013, the morphology and the location consistent with multiple sclerosis  The diagnosis is confirmed by spinal fluid testing in March 2021, showed more than oligoclonal band  We went over [facility] website, offered treatment options,  Laboratory evaluation to rule out MS mimics  Will return to clinic in 6 weeks to discuss treatment options  Complete evaluation with MRI of cervical, thoracic spine  Cancel pending cardiac work-up, she does have vascular risk factor of   diabetes, hyperlipidemia, benefit continue aspirin 81 mg daily, optimize  vascular risk factor control   Marcial Pacas, M.D. Ph.D.  [facility] [address], [place], [place] [address] Ph: [phone] Fax: [phone]  CC: Enid Skeens., MD

## 2020-03-30 NOTE — Telephone Encounter (Signed)
Please cancel her cardiologist appointment

## 2020-03-30 NOTE — Telephone Encounter (Signed)
I called cardiology at [phone]. I spoke to [facility] who canceled her consult appointment.

## 2020-04-01 LAB — MULTIPLE MYELOMA PANEL, SERUM
Albumin SerPl Elph-Mcnc: 3.5 g/dL (ref 2.9–4.4)
Albumin/Glob SerPl: 1.1 (ref 0.7–1.7)
Alpha 1: 0.3 g/dL (ref 0.0–0.4)
Alpha2 Glob SerPl Elph-Mcnc: 0.9 g/dL (ref 0.4–1.0)
B-Globulin SerPl Elph-Mcnc: 1.1 g/dL (ref 0.7–1.3)
Gamma Glob SerPl Elph-Mcnc: 0.9 g/dL (ref 0.4–1.8)
Globulin, Total: 3.2 g/dL (ref 2.2–3.9)
IgA/Immunoglobulin A, Serum: 149 mg/dL (ref 87–352)
IgG (Immunoglobin G), Serum: 949 mg/dL (ref 586–1602)
IgM (Immunoglobulin M), Srm: 102 mg/dL (ref 26–217)
Total Protein: 6.7 g/dL (ref 6.0–8.5)

## 2020-04-01 LAB — VITAMIN B12: Vitamin B-12: 389 pg/mL (ref 232–1245)

## 2020-04-01 LAB — HEPATITIS PANEL, ACUTE
Hep A IgM: NEGATIVE
Hep B C IgM: NEGATIVE
Hep C Virus Ab: <0.1 s/co ratio (ref 0.0–0.9)
Hepatitis B Surface Ag: NEGATIVE

## 2020-04-01 LAB — QUANTIFERON-TB GOLD PLUS
QuantiFERON Mitogen Value: >10 IU/mL
QuantiFERON Nil Value: 0 IU/mL
QuantiFERON TB1 Ag Value: 0 IU/mL
QuantiFERON TB2 Ag Value: 0.02 IU/mL
QuantiFERON-TB Gold Plus: NEGATIVE

## 2020-04-01 LAB — B. BURGDORFI ANTIBODIES: Lyme IgG/IgM Ab: <0.91 {ISR} (ref 0.00–0.90)

## 2020-04-01 LAB — HEPATITIS B CORE ANTIBODY, TOTAL: Hep B Core Total Ab: NEGATIVE

## 2020-04-01 LAB — CK: Total CK: 80 U/L (ref 32–182)

## 2020-04-01 LAB — TSH: TSH: 1.13 u[IU]/mL (ref 0.450–4.500)

## 2020-04-01 LAB — C-REACTIVE PROTEIN: CRP: 3 mg/L (ref 0–10)

## 2020-04-01 LAB — RPR: RPR Ser Ql: NONREACTIVE

## 2020-04-01 LAB — HIV ANTIBODY (ROUTINE TESTING W REFLEX): HIV Screen 4th Generation wRfx: NONREACTIVE

## 2020-04-01 LAB — VARICELLA ZOSTER ANTIBODY, IGG: Varicella zoster IgG: 636 index (ref >165)

## 2020-04-01 LAB — SEDIMENTATION RATE: Sed Rate: 36 mm/hr — ABNORMAL HIGH (ref 0–32)

## 2020-04-01 LAB — ANA W/REFLEX IF POSITIVE: Anti Nuclear Antibody (ANA): NEGATIVE

## 2020-04-01 LAB — HGB A1C W/O EAG: Hgb A1c MFr Bld: 6.8 % — ABNORMAL HIGH (ref 4.8–5.6)

## 2020-04-01 LAB — VITAMIN D 25 HYDROXY (VIT D DEFICIENCY, FRACTURES): Vit D, 25-Hydroxy: 27.8 ng/mL — ABNORMAL LOW (ref 30.0–100.0)

## 2020-04-01 LAB — HEPATITIS B SURFACE ANTIBODY,QUALITATIVE: Hep B Surface Ab, Qual: REACTIVE

## 2020-04-02 ENCOUNTER — Telehealth: Payer: Self-pay | Admitting: Neurology

## 2020-04-02 NOTE — Telephone Encounter (Signed)
Xanax was sent to the pharmacy on 03/30/20. She will pick up the prescription.

## 2020-04-02 NOTE — Telephone Encounter (Signed)
no to the covid questions MR Cervical spine w/wo contrast & MR Thoracic spine w/wo contrast Dr. Willette Pa Auth: [id number] (exp. 04/01/20 to 09/27/20). The patient informed me that she is claustrophobic  and would need something to help her. She is aware to have a driver.

## 2020-04-06 ENCOUNTER — Telehealth: Payer: Self-pay

## 2020-04-06 NOTE — Telephone Encounter (Addendum)
She is JC virus positive, with titer of 2.18  She was given treatment option as following, she is not a candidate for long-term Tysabri infusion with positive JC virus titer of 2.18,  We discussed detail with her at next visit on July 19  [email or web address]  Pills  Aubagio (teriflunomide)  Dimethyl Fumarate (dimethyl fumarate - generic equivalent of Tecfidera)  Gilenya (fingolimod)--similar Mayzent (siponimod)  injection  Ocrevus (ocrelizumab) Tysabri (natalizumab)

## 2020-04-06 NOTE — Telephone Encounter (Signed)
JCV ab drawn 03/30/20. Positive, index: 2.18.

## 2020-04-08 ENCOUNTER — Other Ambulatory Visit: Payer: BC Managed Care – PPO

## 2020-04-08 ENCOUNTER — Ambulatory Visit: Payer: BC Managed Care – PPO

## 2020-04-08 DIAGNOSIS — G35 Multiple sclerosis: Secondary | ICD-10-CM | POA: Diagnosis not present

## 2020-04-08 MED ORDER — GADOBENATE DIMEGLUMINE 529 MG/ML IV SOLN
15.0000 mL | Freq: Once | INTRAVENOUS | Status: AC | PRN
  Administered 2020-04-08: 15 mL via INTRAVENOUS

## 2020-04-13 ENCOUNTER — Telehealth: Payer: Self-pay | Admitting: Neurology

## 2020-04-13 NOTE — Telephone Encounter (Signed)
MRI cervical spine (with and without) demonstrating: - At C4-5: disc bulging and uncovertebral joint hypertrophy with mild spinal stenosis and severe biforaminal stenosis  - At C5-6: disc bulging with mild spinal stenosis and severe biforaminal stenosis  - At C3-4: disc bulging and uncovertebral joint hypertrophy with severe biforaminal stenosis  - At C6-7: disc bulging with severe left foraminal stenosis   Please call patient, MRI of thoracic spine was normal, MRI of cervical spine showed multilevel mild degenerative changes, no evidence of spinal cord compression, there is evidence of severe bilateral foraminal stenosis at C3-4, C5-6, there is no evidence of MS involvement of cervical spinal cord

## 2020-04-13 NOTE — Telephone Encounter (Signed)
I spoke to the patient and provided her with the MRI findings below. She verbalized understanding. She will keep her pending appt for NCV/EMG on 05/04/20.

## 2020-04-23 ENCOUNTER — Institutional Professional Consult (permissible substitution): Payer: BC Managed Care – PPO | Admitting: Internal Medicine

## 2020-05-04 ENCOUNTER — Other Ambulatory Visit: Payer: Self-pay

## 2020-05-04 ENCOUNTER — Ambulatory Visit (INDEPENDENT_AMBULATORY_CARE_PROVIDER_SITE_OTHER): Payer: BC Managed Care – PPO | Admitting: Neurology

## 2020-05-04 DIAGNOSIS — G5603 Carpal tunnel syndrome, bilateral upper limbs: Secondary | ICD-10-CM

## 2020-05-04 DIAGNOSIS — M542 Cervicalgia: Secondary | ICD-10-CM

## 2020-05-04 DIAGNOSIS — G35 Multiple sclerosis: Secondary | ICD-10-CM

## 2020-05-04 DIAGNOSIS — R202 Paresthesia of skin: Secondary | ICD-10-CM

## 2020-05-04 MED ORDER — DULOXETINE HCL 60 MG PO CPEP: 60.0000 mg | ORAL_CAPSULE | Freq: Every day | ORAL | 4 refills | Status: DC

## 2020-05-04 NOTE — Procedures (Signed)
Full Name: Joanne Reed Gender: Female MRN #: [id number] Date of Birth: 11/15/1970    Visit Date: 05/04/2020 08:01 Age: 49 Years Examining Physician: Marcial Pacas, MD  Referring Physician: Marcial Pacas, MD Height: 5 feet 4 inch History: 49 year old female referred presented with neck pain, radiating into her right shoulder and arm, bilateral hands paresthesia  Summary of the tests: Nerve conduction study: Right median sensory responses showed mildly prolonged peak latency with normal snap amplitude.  Left median mixed response was 0.5 ms prolonged in comparison to ipsilateral ulnar response.  Bilateral ulnar, median motor responses were normal.  Bilateral ulnar sensory responses were normal.  Electromyography: Selected needle examination was performed at right upper extremity muscles and right cervical paraspinal muscles.  Conclusion: This is an abnormal study.  There is electrodiagnostic evidence of bilateral median neuropathy across the wrist, consistent with mild bilateral carpal tunnel syndromes.  There is no evidence of right cervical radiculopathy.    ------------------------------- Marcial Pacas, M.D. PhD  [facility] [address] [place], [place] [address] Tel: [phone] Fax: [phone]  Verbal informed consent was obtained from the patient, patient was informed of potential risk of procedure, including bruising, bleeding, hematoma formation, infection, muscle weakness, muscle pain, numbness, among others.        Runnels    Nerve / Sites Muscle Latency Ref. Amplitude Ref. Rel Amp Segments Distance Velocity Ref. Area    ms ms mV mV %  cm m/s m/s mVms  L Median - APB     Wrist APB 3.4 ?4.4 10.5 ?4.0 100 Wrist - APB 7   33.1     Upper arm APB 6.9  10.3  98.8 Upper arm - Wrist 20 58 ?49 31.7  R Median - APB     Wrist APB 4.0 ?4.4 9.5 ?4.0 100 Wrist - APB 7   28.9     Upper arm APB 7.6  9.2  96.8 Upper arm - Wrist 21 58 ?49 29.0  L Ulnar -  ADM     Wrist ADM 2.6 ?3.3 11.5 ?6.0 100 Wrist - ADM 7   27.7     B.Elbow ADM 5.3  10.8  94.4 B.Elbow - Wrist 18 66 ?49 26.8     A.Elbow ADM 6.9  9.6  88.2 A.Elbow - B.Elbow 10 64 ?49 25.3  R Ulnar - ADM     Wrist ADM 2.7 ?3.3 10.4 ?6.0 100 Wrist - ADM 7   26.4     B.Elbow ADM 5.6  9.8  94.3 B.Elbow - Wrist 19 65 ?49 26.2     A.Elbow ADM 7.2  9.5  96.3 A.Elbow - B.Elbow 10 64 ?49 26.0             SNC    Nerve / Sites Rec. Site Peak Lat Ref.  Amp Ref. Segments Distance Peak Diff Ref.    ms ms V V  cm ms ms  L Median, Ulnar - Transcarpal comparison     Median Palm Wrist 2.4 ?2.2 83 ?35 Median Palm - Wrist 8       Ulnar Palm Wrist 1.8 ?2.2 26 ?12 Ulnar Palm - Wrist 8          Median Palm - Ulnar Palm  0.5 ?0.4  L Median - Orthodromic (Dig II, Mid palm)     Dig II Wrist 3.4 ?3.4 14 ?10 Dig II - Wrist 13    R Median - Orthodromic (Dig II, Mid  palm)     Dig II Wrist 3.9 ?3.4 12 ?10 Dig II - Wrist 13    L Ulnar - Orthodromic, (Dig V, Mid palm)     Dig V Wrist 2.6 ?3.1 9 ?5 Dig V - Wrist 11    R Ulnar - Orthodromic, (Dig V, Mid palm)     Dig V Wrist 2.6 ?3.1 10 ?5 Dig V - Wrist 16                 F  Wave    Nerve F Lat Ref.   ms ms  L Ulnar - ADM 23.6 ?32.0  R Ulnar - ADM 25.5 ?32.0         EMG Summary Table    Spontaneous MUAP Recruitment  Muscle IA Fib PSW Fasc Other Amp Dur. Poly Pattern  R. First dorsal interosseous Normal None None None _______ Normal Normal Normal Normal  R. Pronator teres Normal None None None _______ Normal Normal Normal Normal  R. Biceps brachii Normal None None None _______ Normal Normal Normal Normal  R. Deltoid Normal None None None _______ Normal Normal Normal Normal  R. Triceps brachii Normal None None None _______ Normal Normal Normal Normal  R. Extensor digitorum communis Normal None None None _______ Normal Normal Normal Normal  R. Cervical paraspinals Normal None None None _______ Normal Normal Normal Normal

## 2020-05-04 NOTE — Progress Notes (Signed)
PATIENT: TECORA EUSTACHE DOB: 07-Oct-1971  No chief complaint on file.    HISTORICAL  REEANNA ACRI is a 49 year old female, seen in request by her primary care physician Dr. Cecille Amsterdam for evaluation of sudden onset right side difficulty, initial evaluation was on January 01, 2020.  I have reviewed and summarized the referring note from the referring physician.  She has past medical history of hyperlipidemia, diabetes, which was under suboptimal control, A1c in February 2019 was 9.0, she was not on any antiplatelet treatment before her hospital admission to [facility] on December 13, 2019.  She works as a International aid/development worker, while walking on the hallway on December 13, 2019, she had a sudden onset right side difficulty, described numbness involving right face, arm, leg, mild difficulty walking due to right leg weakness, difficulty raising right arm against gravity, slurred speech, she was admitted to [facility], I do not have records to review,  I was able to find her MRI imaging which is under last name Ashiyah, [place], I personally reviewed MRI of the brain, no acute abnormality, there was scattered periventricular white matter changes, perpendicular to ventricle, no significant change compared to previous scan in 2013, No evidence of acute intracranial abnormality.  MRA of brain and neck showed no significant large vessel disease,  Per patient, she also had normal echocardiogram, she was discharged with aspirin plus Plavix, complains of easy bruise,  She had long history of depression, anxiety, previously was treated with Zoloft, Effexor without any benefit, she complains of worsening anxiety symptoms since she was discharged from hospital, frequent hyperventilation, fatigue, as if she is going into a panic attack.  She has been treated with clonazepam 0.5 mg twice a day as needed, which is helpful, her right arm weakness last about 30 minutes, right arm weakness last  about 2 days, she continue has mild right leg heaviness, mild slurred speech, word finding difficulties.  From reviewing the record, she also had lumbar puncture in 2013, following her abnormal MRI of the brain done, but I do not have the report.  Patient was infected with COVID-19 in November 2020, she lost the taste, smell, had severe headache for about a week, but she denies fever, shortness of breath, CT of the chest in December 2020 showed no significant abnormality.  She now complains of frequent chest pain, happening a sitting position, lasting for couple hours, she felt difficulty breathing during the spells, denies heart palpitation  UPDATE March 30 2020: She complains of mild gait abnormality worsening urinary urgency  I was able to review [facility] health hospital admission from February 20 7 March 11/06/2019, hyperlipidemia, type 2 diabetes, anxiety, transient mild speech difficulty-on February 25, right arm weakness, blood pressure was 139/65, laboratory showed normal CBC, negative troponin, chest x-ray showed no active cardiopulmonary disease, CT head showed no acute abnormality, she was treated with aspirin, Plavix, evaluated by telemetry neurologist, A1c6.4, suggested Plavix plus aspirin for 3 weeks, then aspirin 81 mg alone,   I personally reviewed MRI of the brain without contrast December 16, 2019, no acute abnormality, cerebral white matter disease similar to previous MRI in November 2013, location and morphology most consistent with multiple sclerosis  Spinal tap March 2021 showed more than 5 oligoclonal band  laboratories, hemoglobin 14.2, creatinine 0.5, normal TSH 1.64, free T4 1.25 EKG showed normal sinus rhythm echocardiogram ejection fraction 60 to 65%, no acute abnormality, left atrium was normal in size, negative bubble study, negative, MRA of  the neck was negative  UPDATE May 04 2020: We personally reviewed MRI cervical spine, multilevel degenerative changes, no evidence  of MS involvement, canal stenosis, variable degree of foraminal narrowing, most noticeable at C 3 4, C4-5, C5-6, and left C6-7  MRI of thoracic spine showed no significant abnormality.  Extensive laboratory evaluation showed normal or negative varicella-zoster IgG, hepatitis B surface antibody, hepatitis panel, TB, ANA, Lyme titer, protein electrophoresis, C-reactive protein, TSH, CPK, HIV, RPR, B12, lipid panel, A1c was elevated 6.8, vitamin D was 27.8, ESR was mildly elevated at 36,  JC virus titer March 30, 2020 was positive, with titer of 2.18,  MRI of the brain in 2021 to previous scan in 2013, and even in 2006 (under the name Jalie, Eil[place]), there was periventricular oval-shaped lesion, but fairly stable over the years, no contrast-enhancement,  Spinal fluid testing in April 2021 showed more than 5, restriction band I observed in CSF and serum  She denies gait abnormality, main concern is moderate neck pain, 5 out of 10, radiating discomfort to right upper extremity, she tolerate Cymbalta 60 mg every day,  She return for electrodiagnostic study today, which confirmed mild bilateral carpal tunnel syndrome. REVIEW OF SYSTEMS: Full 14 system review of systems performed and notable only for as above All other review of systems were negative.  ALLERGIES: No Known Allergies  HOME MEDICATIONS: Current Outpatient Medications  Medication Sig Dispense Refill  . ALPRAZolam (XANAX) 1 MG tablet Take 1-2 tablets 30 minutes prior to MRI, may repeat once as needed. Must have driver. 3 tablet 0  . aspirin EC 81 MG tablet Take 81 mg by mouth daily.    Marland Kitchen atorvastatin (LIPITOR) 20 MG tablet Take 20 mg by mouth daily.    . DULoxetine (CYMBALTA) 60 MG capsule Take 1 capsule (60 mg total) by mouth daily. 30 capsule 12  . GLIPIZIDE XL 5 MG 24 hr tablet Take 5 mg by mouth 2 (two) times daily.    . metFORMIN (GLUCOPHAGE-XR) 500 MG 24 hr tablet Take 500 mg by mouth 5 (five) times daily.     No current  facility-administered medications for this visit.    PAST MEDICAL HISTORY: Past Medical History:  Diagnosis Date  . Abnormal finding on MRI of brain 01/01/2020  . Anxiety   . Diabetes mellitus without complication ([place])   . Hypercholesteremia   . Hyperlipidemia   . Migraine   . Thyromegaly 09/20/2013  . TIA (transient ischemic attack)   . Uncontrolled type 2 diabetes mellitus with diabetic polyneuropathy, without long-term current use of insulin ([place]) 07/10/2017    PAST SURGICAL HISTORY: Past Surgical History:  Procedure Laterality Date  . ABDOMINAL HYSTERECTOMY  2009   [facility]--Dr. Quincy Simmonds  . [place]  . cholecystectomy      FAMILY HISTORY: Family History  Problem Relation Age of Onset  . Kidney cancer Mother   . Skin cancer Mother        unsure of type - not melanoma - place removed from nose.  . Esophageal cancer Father   . Hypertension Brother   . Diabetes Paternal Grandmother     SOCIAL HISTORY: Social History   Socioeconomic History  . Marital status: Married    Spouse name: Not on file  . Number of children: 3  . Years of education: college  . Highest education level: Not on file  Occupational History  . Occupation: Pharmacist, hospital  Tobacco Use  . Smoking status: Former Research scientist (life sciences)  . Smokeless  tobacco: Never Used  . Tobacco comment: 01/01/20 - quit 22 years ago  Substance and Sexual Activity  . Alcohol use: Yes    Comment: occasional  . Drug use: Never  . Sexual activity: Yes    Partners: Male    Birth control/protection: None, Surgical    Comment: [facility]  Other Topics Concern  . Not on file  Social History Narrative   Lives at home husband.   Right-handed.   Caffeine use: 20 oz of green tea daily.   Social Determinants of Health   Financial Resource Strain:   . Difficulty of Paying Living Expenses:   Food Insecurity:   . Worried About Charity fundraiser in the Last Year:   . Arboriculturist in the Last Year:   Transportation  Needs:   . Film/video editor (Medical):   Marland Kitchen Lack of Transportation (Non-Medical):   Physical Activity:   . Days of Exercise per Week:   . Minutes of Exercise per Session:   Stress:   . Feeling of Stress :   Social Connections:   . Frequency of Communication with Friends and Family:   . Frequency of Social Gatherings with Friends and Family:   . Attends Religious Services:   . Active Member of Clubs or Organizations:   . Attends Archivist Meetings:   Marland Kitchen Marital Status:   Intimate Partner Violence:   . Fear of Current or Ex-Partner:   . Emotionally Abused:   Marland Kitchen Physically Abused:   . Sexually Abused:      PHYSICAL EXAM   There were no vitals filed for this visit. Not recorded     There is no height or weight on file to calculate BMI.  PHYSICAL EXAMNIATION:  Gen: NAD, conversant, well nourised, well groomed                     Cardiovascular: Regular rate rhythm, no peripheral edema, warm, nontender. Eyes: Conjunctivae clear without exudates or hemorrhage Neck: Supple, no carotid bruits. Pulmonary: Clear to auscultation bilaterally   NEUROLOGICAL EXAM:  MENTAL STATUS: Speech:    Speech is normal; fluent and spontaneous with normal comprehension.  Cognition:     Orientation to time, place and person     Normal recent and remote memory     Normal Attention span and concentration     Normal Language, naming, repeating,spontaneous speech     Fund of knowledge   CRANIAL NERVES: CN II: Visual fields are full to confrontation. Pupils are round equal and briskly reactive to light. CN III, IV, VI: extraocular movement are normal. No ptosis. CN V: Facial sensation is intact to light touch CN VII: Face is symmetric with normal eye closure  CN VIII: Hearing is normal to causal conversation. CN IX, X: Phonation is normal. CN XI: Head turning and shoulder shrug are intact  MOTOR: There is no pronator drift of out-stretched arms. Muscle bulk and tone are  normal. Muscle strength is normal.  REFLEXES: Reflexes are 2+ and symmetric at the biceps, triceps, knees, and ankles. Plantar responses are flexor.  SENSORY: Intact to light touch, pinprick and vibratory sensation are intact in fingers and toes.  COORDINATION: There is no trunk or limb dysmetria noted.  GAIT/STANCE: Steady, cautious  DIAGNOSTIC DATA (LABS, IMAGING, TESTING) - I reviewed patient records, labs, notes, testing and imaging myself where available.   ASSESSMENT AND PLAN  SHERESA CULLOP is a 49 y.o. female   Possible relapsing  remitting multiple sclerosis  MRI of the brain showed periventricular white matter changes, which has been present since 2013, 2006, the morphology and the location consistent with multiple sclerosis  Spinal fluid testing in April 2021, showed more than 5 oligoclonal band that is present in CSF also serum  Extensive laboratory evaluation showed no treatable etiology, JC virus titer was 2.18June 2021  I have discussed with patient, the options are continue to observe her symptoms, and repeat MRI of the brain with without contrast in 1 to 2 years, overall her imaging, and clinical presentation has been stable, the other option would started on low dose of Aubagio 7 mg daily, we decided to continue to observe her symptoms,  Bilateral carpal tunnel syndromes  Wrist splint   Marcial Pacas, M.D. Ph.D.  [facility] [address], [place], [place] [address] Ph: [phone] Fax: [phone]  CC: Enid Skeens., MD

## 2020-07-02 ENCOUNTER — Telehealth: Payer: Self-pay | Admitting: Cardiology

## 2020-07-02 DIAGNOSIS — G43909 Migraine, unspecified, not intractable, without status migrainosus: Secondary | ICD-10-CM | POA: Insufficient documentation

## 2020-07-02 DIAGNOSIS — E785 Hyperlipidemia, unspecified: Secondary | ICD-10-CM | POA: Insufficient documentation

## 2020-07-02 DIAGNOSIS — E7800 Pure hypercholesterolemia, unspecified: Secondary | ICD-10-CM | POA: Insufficient documentation

## 2020-07-02 DIAGNOSIS — E119 Type 2 diabetes mellitus without complications: Secondary | ICD-10-CM | POA: Insufficient documentation

## 2020-07-02 NOTE — Telephone Encounter (Signed)
Spoke to patient just now and requested that we move up her appointment for tomorrow to 1 o'clock. She states that she will call me back to let me know if this is possible for her or not.

## 2020-07-02 NOTE — Telephone Encounter (Signed)
New Message  Pt is calling and says she is retuning [facility] call   Please call back

## 2020-07-03 ENCOUNTER — Emergency Department (HOSPITAL_COMMUNITY): Payer: BC Managed Care – PPO

## 2020-07-03 ENCOUNTER — Emergency Department (HOSPITAL_COMMUNITY)
Admission: EM | Admit: 2020-07-03 | Discharge: 2020-07-04 | Disposition: A | Discharge status: Home / Self Care | Payer: BC Managed Care – PPO | Attending: Emergency Medicine | Admitting: Emergency Medicine

## 2020-07-03 ENCOUNTER — Ambulatory Visit: Payer: BC Managed Care – PPO | Admitting: Cardiology

## 2020-07-03 DIAGNOSIS — Z7982 Long term (current) use of aspirin: Secondary | ICD-10-CM | POA: Insufficient documentation

## 2020-07-03 DIAGNOSIS — S50811A Abrasion of right forearm, initial encounter: Secondary | ICD-10-CM | POA: Diagnosis not present

## 2020-07-03 DIAGNOSIS — Z23 Encounter for immunization: Secondary | ICD-10-CM | POA: Diagnosis not present

## 2020-07-03 DIAGNOSIS — S59911A Unspecified injury of right forearm, initial encounter: Secondary | ICD-10-CM | POA: Diagnosis present

## 2020-07-03 DIAGNOSIS — E1142 Type 2 diabetes mellitus with diabetic polyneuropathy: Secondary | ICD-10-CM | POA: Insufficient documentation

## 2020-07-03 DIAGNOSIS — Z7984 Long term (current) use of oral hypoglycemic drugs: Secondary | ICD-10-CM | POA: Diagnosis not present

## 2020-07-03 DIAGNOSIS — T1490XA Injury, unspecified, initial encounter: Secondary | ICD-10-CM

## 2020-07-03 DIAGNOSIS — Y92410 Unspecified street and highway as the place of occurrence of the external cause: Secondary | ICD-10-CM | POA: Insufficient documentation

## 2020-07-03 DIAGNOSIS — Y9389 Activity, other specified: Secondary | ICD-10-CM | POA: Diagnosis not present

## 2020-07-03 DIAGNOSIS — R10819 Abdominal tenderness, unspecified site: Secondary | ICD-10-CM | POA: Insufficient documentation

## 2020-07-03 DIAGNOSIS — S0990XA Unspecified injury of head, initial encounter: Secondary | ICD-10-CM | POA: Diagnosis not present

## 2020-07-03 DIAGNOSIS — Z87891 Personal history of nicotine dependence: Secondary | ICD-10-CM | POA: Diagnosis not present

## 2020-07-03 LAB — I-STAT BETA HCG BLOOD, ED (MC, WL, AP ONLY): I-stat hCG, quantitative: <5 m[IU]/mL (ref <5)

## 2020-07-03 LAB — COMPREHENSIVE METABOLIC PANEL
ALT: 20 U/L (ref 0–44)
AST: 21 U/L (ref 15–41)
Albumin: 3.6 g/dL (ref 3.5–5.0)
Alkaline Phosphatase: 96 U/L (ref 38–126)
Anion gap: 10 (ref 5–15)
BUN: 12 mg/dL (ref 6–20)
CO2: 23 mmol/L (ref 22–32)
Calcium: 8.9 mg/dL (ref 8.9–10.3)
Chloride: 102 mmol/L (ref 98–111)
Creatinine, Ser: 0.68 mg/dL (ref 0.44–1.00)
GFR calc Af Amer: >60 mL/min (ref >60)
GFR calc non Af Amer: >60 mL/min (ref >60)
Glucose, Bld: 245 mg/dL — ABNORMAL HIGH (ref 70–99)
Potassium: 3.2 mmol/L — ABNORMAL LOW (ref 3.5–5.1)
Sodium: 135 mmol/L (ref 135–145)
Total Bilirubin: 0.8 mg/dL (ref 0.3–1.2)
Total Protein: 6.7 g/dL (ref 6.5–8.1)

## 2020-07-03 LAB — CBC
HCT: 41.9 % (ref 36.0–46.0)
Hemoglobin: 13.5 g/dL (ref 12.0–15.0)
MCH: 29 pg (ref 26.0–34.0)
MCHC: 32.2 g/dL (ref 30.0–36.0)
MCV: 90.1 fL (ref 80.0–100.0)
Platelets: 378 10*3/uL (ref 150–400)
RBC: 4.65 MIL/uL (ref 3.87–5.11)
RDW: 13.2 % (ref 11.5–15.5)
WBC: 18.2 10*3/uL — ABNORMAL HIGH (ref 4.0–10.5)
nRBC: 0 % (ref 0.0–0.2)

## 2020-07-03 MED ORDER — IOHEXOL 300 MG/ML  SOLN
100.0000 mL | Freq: Once | INTRAMUSCULAR | Status: AC | PRN
  Administered 2020-07-03: 100 mL via INTRAVENOUS

## 2020-07-03 MED ORDER — MORPHINE SULFATE (PF) 4 MG/ML IV SOLN: 4.0000 mg | Freq: Once | INTRAVENOUS | Status: DC
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
## 2020-07-04 LAB — HEMOGLOBIN AND HEMATOCRIT, BLOOD
HCT: 40.6 % (ref 36.0–46.0)
Hemoglobin: 13.1 g/dL (ref 12.0–15.0)

## 2020-07-04 MED ORDER — TETANUS-DIPHTH-ACELL PERTUSSIS 5-2.5-18.5 LF-MCG/0.5 IM SUSP
0.5000 mL | Freq: Once | INTRAMUSCULAR | Status: AC
  Administered 2020-07-04: 0.5 mL via INTRAMUSCULAR
  Filled 2020-07-04: qty 0.5

## 2020-07-04 MED ORDER — OXYCODONE-ACETAMINOPHEN 5-325 MG PO TABS: 2.0000 | ORAL_TABLET | ORAL | 0 refills | Status: DC | PRN

## 2020-07-04 MED ORDER — METHOCARBAMOL 500 MG PO TABS: 500.0000 mg | ORAL_TABLET | Freq: Two times a day (BID) | ORAL | 0 refills | Status: DC

## 2020-07-04 MED ORDER — HYDROMORPHONE HCL 1 MG/ML IJ SOLN
1.0000 mg | Freq: Once | INTRAMUSCULAR | Status: AC
  Administered 2020-07-04: 1 mg via INTRAVENOUS
  Filled 2020-07-04: qty 1

## 2020-07-04 MED ORDER — MELOXICAM 7.5 MG PO TABS: 7.5000 mg | ORAL_TABLET | Freq: Every day | ORAL | 0 refills | Status: DC
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
## 2020-08-24 ENCOUNTER — Ambulatory Visit (INDEPENDENT_AMBULATORY_CARE_PROVIDER_SITE_OTHER): Payer: BC Managed Care – PPO | Admitting: Cardiology

## 2020-08-24 ENCOUNTER — Encounter: Payer: Self-pay | Admitting: Cardiology

## 2020-08-24 ENCOUNTER — Other Ambulatory Visit: Payer: Self-pay

## 2020-08-24 VITALS — BP 122/74 | HR 72 | Ht 64.0 in | Wt 216.2 lb

## 2020-08-24 DIAGNOSIS — G459 Transient cerebral ischemic attack, unspecified: Secondary | ICD-10-CM

## 2020-08-24 DIAGNOSIS — E785 Hyperlipidemia, unspecified: Secondary | ICD-10-CM | POA: Diagnosis not present

## 2020-08-24 DIAGNOSIS — E1159 Type 2 diabetes mellitus with other circulatory complications: Secondary | ICD-10-CM | POA: Diagnosis not present

## 2020-08-24 NOTE — Patient Instructions (Signed)

## 2020-10-17 DIAGNOSIS — G35D Multiple sclerosis, unspecified: Secondary | ICD-10-CM

## 2020-10-17 HISTORY — DX: Multiple sclerosis, unspecified: G35.D

## 2020-11-05 ENCOUNTER — Ambulatory Visit: Payer: BC Managed Care – PPO | Admitting: Neurology

## 2020-12-22 ENCOUNTER — Other Ambulatory Visit: Payer: Self-pay | Admitting: *Deleted

## 2020-12-22 ENCOUNTER — Ambulatory Visit: Payer: BC Managed Care – PPO | Admitting: Neurology

## 2020-12-22 ENCOUNTER — Encounter: Payer: Self-pay | Admitting: Neurology

## 2020-12-22 DIAGNOSIS — R413 Other amnesia: Secondary | ICD-10-CM

## 2020-12-22 DIAGNOSIS — R9089 Other abnormal findings on diagnostic imaging of central nervous system: Secondary | ICD-10-CM

## 2020-12-22 DIAGNOSIS — R269 Unspecified abnormalities of gait and mobility: Secondary | ICD-10-CM | POA: Diagnosis not present

## 2020-12-22 DIAGNOSIS — R4189 Other symptoms and signs involving cognitive functions and awareness: Secondary | ICD-10-CM

## 2020-12-22 MED ORDER — ALPRAZOLAM 1 MG PO TABS: ORAL_TABLET | ORAL | 0 refills | Status: DC
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
## 2020-12-23 ENCOUNTER — Other Ambulatory Visit: Payer: Self-pay | Admitting: Neurology

## 2020-12-23 MED ORDER — ALPRAZOLAM 1 MG PO TABS
ORAL_TABLET | ORAL | 0 refills | Status: DC
Start: 2020-12-23 — End: 2022-02-09

## 2020-12-23 NOTE — Addendum Note (Signed)
Addended by: Desmond Lope on: 12/23/2020 04:43 PM   Modules accepted: Orders

## 2020-12-23 NOTE — Telephone Encounter (Signed)
I spoke to the patient. She has taken alprazolam for previous MRI scans and it worked well. She understands that she must have a driver. Rx sent to [facility] for approval.

## 2020-12-23 NOTE — Telephone Encounter (Signed)
MRI Brain w/wo contrast and MRI cervical wo contrast 75 minutes Dr. Willette Pa Josem Kaufmann: [id number] exp 12/23/20-06/20/21  Scheduled 12/30/20 at [facility]  Patient would like an anxiety medication for the MRI

## 2020-12-24 ENCOUNTER — Telehealth: Payer: Self-pay | Admitting: Neurology

## 2020-12-24 ENCOUNTER — Encounter: Payer: Self-pay | Admitting: Counselor

## 2020-12-24 NOTE — Telephone Encounter (Signed)
MRI Brain w/wo contrast and MRI cervical wo contrast 75 minutes Dr. Willette Pa Josem Kaufmann: [id number] exp 12/23/20-06/20/21. Patient scheduled at [facility] for 12/30/20.

## 2020-12-30 ENCOUNTER — Ambulatory Visit: Payer: BC Managed Care – PPO

## 2020-12-30 DIAGNOSIS — R269 Unspecified abnormalities of gait and mobility: Secondary | ICD-10-CM | POA: Diagnosis not present

## 2020-12-30 DIAGNOSIS — R9089 Other abnormal findings on diagnostic imaging of central nervous system: Secondary | ICD-10-CM

## 2020-12-30 MED ORDER — GADOBENATE DIMEGLUMINE 529 MG/ML IV SOLN
20.0000 mL | Freq: Once | INTRAVENOUS | Status: AC | PRN
  Administered 2020-12-30: 20 mL via INTRAVENOUS

## 2021-01-01 ENCOUNTER — Telehealth: Payer: Self-pay | Admitting: Neurology

## 2021-01-01 NOTE — Telephone Encounter (Signed)
  IMPRESSION:   MRI brain (with and without) demonstrating: -Stable periventricular and subcortical foci of nonspecific T2 hyperintensities. Considerations include autoimmune, inflammatory, post-infectious, or microvascular ischemic etiologies.  -No abnormal enhancing lesions.  No new findings compared to 12/16/2019.  IMPRESSION:   MRI cervical spine (without) demonstrating: - No intrinsic, compressive or abnormal enhancing spinal cord lesions. - At C4-5 disc bulging and uncovertebral joint hypertrophy with mild spinal stenosis and severe right and moderate left foraminal stenosis. - At C5-6 disc bulging and facet hypertrophy with mild spinal stenosis and severe bilateral foraminal stenosis. - At C3-4 disc bulging and uncovertebral joint hypertrophy with moderate bilateral foraminal stenosis. - At C6-7 uncovertebral joint hypertrophy with with moderate left foraminal stenosis.  Please call patient, MRI of the brain continue showed evidence of supratentorium T2/flair lesions, signal abnormality, no change compared to previous scan in March 2021, stable, most likely small vessel disease,  MRI of cervical spine showed multi-level degenerative changes, but no evidence of spinal cord signal abnormality  B0 degree of foraminal narrowing involving C4-5, C5-6, C3-4, C6-7 level, no cord compression

## 2021-01-04 NOTE — Telephone Encounter (Signed)
I spoke to the patient and she verbalized understanding of the MRI findings. She will keep her pending follow up for further review with Dr. Krista Blue.

## 2021-01-21 ENCOUNTER — Encounter: Payer: Self-pay | Admitting: Neurology

## 2021-01-21 ENCOUNTER — Ambulatory Visit: Payer: BC Managed Care – PPO | Admitting: Neurology

## 2021-01-21 ENCOUNTER — Other Ambulatory Visit: Payer: Self-pay

## 2021-01-21 VITALS — BP 139/87 | HR 75 | Ht 64.0 in | Wt 223.0 lb

## 2021-01-21 DIAGNOSIS — M4722 Other spondylosis with radiculopathy, cervical region: Secondary | ICD-10-CM | POA: Insufficient documentation

## 2021-01-21 DIAGNOSIS — F419 Anxiety disorder, unspecified: Secondary | ICD-10-CM | POA: Diagnosis not present

## 2021-01-21 DIAGNOSIS — R202 Paresthesia of skin: Secondary | ICD-10-CM | POA: Diagnosis not present

## 2021-01-21 DIAGNOSIS — M47812 Spondylosis without myelopathy or radiculopathy, cervical region: Secondary | ICD-10-CM

## 2021-01-21 DIAGNOSIS — R9089 Other abnormal findings on diagnostic imaging of central nervous system: Secondary | ICD-10-CM

## 2021-01-21 MED ORDER — TRAZODONE HCL 50 MG PO TABS: 100.0000 mg | ORAL_TABLET | Freq: Every day | ORAL | 6 refills | Status: DC
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
## 2021-02-16 ENCOUNTER — Other Ambulatory Visit: Payer: Self-pay

## 2021-02-16 ENCOUNTER — Encounter: Payer: Self-pay | Admitting: Counselor

## 2021-02-16 ENCOUNTER — Ambulatory Visit: Payer: BC Managed Care – PPO

## 2021-02-16 ENCOUNTER — Ambulatory Visit (INDEPENDENT_AMBULATORY_CARE_PROVIDER_SITE_OTHER): Payer: BC Managed Care – PPO | Admitting: Counselor

## 2021-02-16 DIAGNOSIS — G3184 Mild cognitive impairment, so stated: Secondary | ICD-10-CM

## 2021-02-16 DIAGNOSIS — R9089 Other abnormal findings on diagnostic imaging of central nervous system: Secondary | ICD-10-CM

## 2021-02-16 DIAGNOSIS — F09 Unspecified mental disorder due to known physiological condition: Secondary | ICD-10-CM

## 2021-02-16 NOTE — Progress Notes (Signed)
   Psychometrist Note   Cognitive testing was administered to Joanne Reed by Joanne Reed, B.S. (Technician) under the supervision of Joanne Reed, Psy.D., ABN. Joanne Reed was able to tolerate all test procedures. Joanne Reed met with the patient as needed to manage any emotional reactions to the testing procedures. Rest breaks were offered.    The battery of tests administered was selected by Joanne Reed with consideration to the patient's current level of functioning, the nature of her symptoms, emotional and behavioral responses during the interview, level of literacy, observed level of motivation/effort, and the nature of the referral question. This battery was communicated to the psychometrist. Communication between Joanne Reed and the psychometrist was ongoing throughout the evaluation and Joanne Reed was immediately accessible at all times. Joanne Reed provided supervision to the technician on the date of this service, to the extent necessary to assure the quality of all services provided.    Joanne Reed will return in approximately one week for an interactive feedback session with Joanne Reed, at which time test performance, clinical impressions, and treatment recommendations will be reviewed in detail. The patient understands she can contact our office should she require our assistance before this time.   A total of 150 minutes of billable time were spent with Joanne Reed by the technician, including test administration and scoring time. Billing for these services is reflected in Joanne Reed note.   This note reflects time spent with the psychometrician and does not include test scores, clinical history, or any interpretations made by Joanne Reed. The full report will follow in a separate note.

## 2021-02-16 NOTE — Progress Notes (Signed)
[facility]  Patient Name: JACQUALYNN PARCO MRN: [id number] Date of Birth: 05/17/1971 Age: 50 y.o. Education: 18 years  Measurement properties of test scores: IQ, Index, and Standard Scores (SS): Mean = 100; Standard Deviation = 15 Scaled Scores (Ss): Mean = 10; Standard Deviation = 3 Z scores (Z): Mean = 0; Standard Deviation = 1 T scores (T); Mean = 50; Standard Deviation = 10 TEST SCORES:    Note: This summary of test scores accompanies the interpretive report and should not be interpreted by unqualified individuals or in isolation without reference to the report. Test scores are relative to age, gender, and educational history as available and appropriate.   Performance Validity        ACS: Raw Descriptor      Word Choice: 42 Below Expectation      MSVT: Raw Descriptor      IR 95 Within Expectation      DR 85 Below Expectation      CNS 90 Within Expectation      PA 90 ---      FR 40 ---      The Dot Counting Test: Raw Descriptor      E-Score 8 Within Expectation      Embedded Measures: Raw Descriptor      WAIS-IV Reliable Digit Span 7 Within Expectation      WAIS-IV Reliable Digit Span Revised 12 Within Expectation      Expected Functioning        Wide Range Achievement Test (Word Reading): Standard/Scaled Score Percentile       Word Reading 87 19      Cognitive Testing            Wechsler Adult Intelligence Scale - IV: Standard/Scaled Score Percentile  Full Scale IQ 75 9  General Ability Index 80 5  Verbal Comprehension Index 76 5      Similarities 7 16      Vocabulary 5 5      Information 5 5  Perceptual Reasoning Index 90 25      Block Design 6 9      Matrix Reasoning 10 50      Visual Puzzles 9 37  Working Memory Index 71 3      Digit Span 5 5          Digit Span Forward 5 5          Digit Span Backward 6 9          Digit Span Sequencing 8 25      Arithmetic 5 5  Processing Speed Index 79 8      Symbol Search  5 5      Coding 7 16      Neuropsychological Assessment Battery (Memory Module, Form 1): T-score/Standard Score Percentile  Memory Index (MEM): 67 1      List Learning           List A Immediate Recall   (3 , 6 , 7) 27 1         List B Immediate Recall   (3) 34 5         List A Short Delayed Recall   (6) 35 7         List A Long Delayed Recall   (5) 32 4         List A Percent Retention   (83 %) --- 34  List A Long Delayed Yes/No Recognition Hits   (11) --- 58         List A Long Delayed Yes/No Recognition False Alarms   (3) --- 38         List A Recognition Discriminability Index --- 42      Shape Learning           Immediate Recognition   (3 , 5 , 3) 29 2         Delayed Recognition   (6) 46 34         Percent Retention   (200 %) --- >99         Delayed Forced-Choice Recognition Hits   (9) --- 86         Delayed Forced-Choice Recognition False Alarms   (1) --- 38         Delayed Forced-Choice Recognition Discriminability --- 73      Story Learning           Immediate Recall   (17, 24) 26 1         Delayed Recall   (24) 31 3         Percent Retention   (100 %) --- 69      Daily Living Memory            Immediate Recall   (23, 17) 38 12          Delayed Recall   (8, 6) 40 16          Percent Retention (88 %) --- 34          Recognition Hits   (10) --- 79      Verbal Fluency: T-score Percentile      Controlled Oral Word Association (F-A-S) 35 7      Semantic Fluency (Animals) 33 5      Trail Making Test: T-Score Percentile      Part A 34 5      Part B 33 5      Modified [place] Card Sorting Test (MWCST): Standard/T-Score Percentile      Number of Categories Correct 35 7      Number of Perseverative Errors 36 8      Number of Total Errors 35 7      Percent Perseverative Errors 42 21  Executive Function Composite 76 5      D-KEFS Color-Word Interference Test: Raw (Scaled Score) Percentile     Color Naming 35 secs. (8) 25     Word Reading 30 secs. (6) 9      Inhibition 69 secs. (8) 25        Total Errors 2 errors (9) 37     Inhibition/Switching 77 secs. (9) 37        Total Errors 2 errors (10) 50      Rating Scales         Raw Score Descriptor  Beck Depression Inventory - II 26 Moderate  Beck Anxiety Inventory 28 Severe   Olga Bourbeau V. Nicole Kindred PsyD, [place] Clinical Neuropsychologist
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
## 2021-02-17 DIAGNOSIS — G3184 Mild cognitive impairment, so stated: Secondary | ICD-10-CM | POA: Insufficient documentation

## 2021-02-17 NOTE — Progress Notes (Signed)
[facility]  Patient Name: Joanne Reed MRN: [id number] Date of Birth: 25-Jan-1971 Age: 50 y.o. Education: 55 years  Clinical Impressions  Joanne Reed is a 50 y.o., right-hand dominant, married woman with a history of T2DM, abnormal brain MRI (stable since 2013), and an episode of right-sided symptoms (numbness of right face, arm, leg, and mild leg weakness) back in February, 2021. She is currently seeing Dr. Krista Blue, who has entertained the thought of MS, although I am not sure she has ever fully met Joanne Reed criteria or been formally diagnosed. She has been having increased difficulties since the Summer, 2021, at work as a Agricultural engineer, particularly with [employer]. She mainly reported difficulties with using punctuation and writing things that are coherent and logical, which have been noted by her superiors. She recently resigned from her position under threat of not being rehired for the upcoming academic year. She notices minimal symptoms outside of work but does feel like she is distractible and not as attentive as in the past. Her friends have noticed that she will send them text messages that don't always make sense. She has a history of anxiety and is having significant affective issues related to all these stressors and is only sleeping 4.5 hours a night, she was previously only sleeping 2.5 hours. She also continues to appreciate some residual right sided symptoms.    Neuropsychological evaluation demonstrated marginal performance validity, viewed as significantly tempering albeit not frankly invalidating interpretation of low scores. There is also evidence of weakness in verbal as compared to nonverbal abilities that is likely premorbid and/or reflective of the patient's learning history, tempering expectations for test performance further. Within these constraints, there is perhaps some weak evidence of decline on measures of working memory and with  respect to memory function. Artifact related to demographic correction is thought to be a significant factor with respect to her memory performance. Nevertheless, for her obtained level of education, her overall memory performance falls in the extremely low range with the profile mainly showing encoding and retrieval issues. There are also some scattered low scores on measures of executive function.   Joanne Reed is thus demonstrating numerous confounding variables that render confident interpretation of her test data challenging. Considering her clinical history and giving her the benefit of the doubt, she may have a mild level of cognitive decline involving working memory, memory encoding, and perhaps also to some extent executive functioning. These test findings may reflect interference from insufficient sleep, significant depression/anxiety, and some contribution from whatever condition is causing her clinical symptoms and radiologic abnormalities cannot be entirely excluded. I think it unlikely that her difficulties performing on the job are due solely to neurological factors and that they most likely reflect the interaction of the above noted issues with a challenging and detail-oriented vocation.   Diagnostic Impressions: Mild neurocognitive disorder due to multiple etiologies Depression with anxiety   Recommendations to be discussed with patient  Your performance and presentation on neuropsychological assessment was consistent with marginal performance validity. Just like a patient must lie still when undergoing an Xray for the picture to be interpretable, there are a set of assumptions that must be met for neuropsychological test findings to be of meaning. Your performance on validity measures suggests that we cannot be entirely sure that was the case. Validity problems can be caused by any number of things such as fatigue, difficulty maintaining attention, sensory impairments, idiosyncratic  approaches to test taking, or misunderstanding test instructions.  In your case, I think that suboptimal performance validity tempers but does not entirely invalidates interpretation of your test findings.   Prior to ascertaining whether a decline in cognition has occurred, it is important to figure out where someone likely started out. We do this by using tests that are typically resistant to cognitive impairment, such as ability to read words, vocabulary, fund of information etc. Like all individuals, you have a pattern of natural cognitive strengths and weaknesses. There is some evidence that you may be a more visual than verbal person, which is important to consider because the difficulties you have on the job are primarily with verbal tasks. This is not a difference that is likely acquired, it is simply the way your brain is wired, the result of your learning history, academic experiences and the like. It thus stands to reason that your preexisting strengths and weaknesses may make it more likely for you to have difficulty with these sorts of things even if they are not the only cause of your difficulties.  The above considerations notwithstanding, I do think it is possible that there are some mild declines in working memory (e.g., ability to hold information "online" and do something with it) and also with respect to memory encoding (I.e., getting information in). These difficulties go well with the day-to-day problems that you are noticing, which involve difficulties with attention and concentration, errors completing highly detail oriented paperwork, and also difficulty composing your thoughts. I am not entirely sure what is causing these problems but think that they are most likely multifactorial.   First, you reported that you are only getting 4.5 hours of sleep and previously were getting 2.5 hours of sleep. Sleep deprivation has multiple acute and cumulative effects on brain performance and can be  extremely undermining to one's day-to-day functioning. I would suggest that you try sleep hygiene strategies, consult with a sleep specialist, or consider behavioral sleep medicine for a full and complete workup of your sleep issues. The following sleep hygiene strategies may be helpful:   There are few things as disruptive to brain functioning as not getting a good night's sleep. For sleep, I recommend against using medications, which can have lingering sedating effects on the brain and rob your brain of restful REM sleep. Instead, consider trying some of the following sleep hygiene recommendations. They may not work at once and may take effort, but the effort you spend is likely to be rewarded with better sleep eventually:  . Stick to a sleep schedule of the same bedtime and wake time even on the weekends, which can help to regulate your body's internal clock so that you fall asleep and stay asleep.  . Practice a relaxing bedtime ritual (conducted away from bright lights) which will help separate your sleep from stimulating activities and prepare your body to fall asleep when you go to bed.  . Avoid naps, especially in the afternoon.  . Evaluate your room and create conditions that will promote sleep such as keeping it cool (between 60 - 67 degrees), quiet, and free from any lights. Consider using blackout curtains, a "white noise" generator, or fan that will help mask any noises that might prevent you from going to sleep or awaken you during the night.  . Sleep on a comfortable mattress and pillows.  . Avoid bright light in the evening and excessive use of portable electronic devices right before bed that may contain light frequencies that can contribute to sleep problems.  . Avoid  alcohol, cigarettes, or heavy meals in the evening. If you must eat, consume a light snack 45 minutes before bed.  . Use your bed only for sleep to strengthen the association between your bed and sleep.  . If you can't go  to sleep within 30 minutes, go into another room and do something relaxing until you feel tired. Then, come back and try to go to sleep again for 30 minutes and repeat until sleep is achieved.  . Some people find over the counter melatonin to be helpful for sleep, which you could discuss with a pharmacist or prescribing provider.   Second, you reported moderate levels of depressive symptoms and severe levels of anxiety symptoms. Be aware that there is a bidirectional relationship between anxiety, depression, and cognitive performance. Although your anxiety and depression may have been exacerbated by your difficulties at work, they are also now likely making it harder for you to perform on the job. Anxiety and depression distract one from the task at hand. They also can promote overfocus on and concern about cognitive performance that then takes cognitive resources away from the task at hand and makes performance errors more likely. First, I suggest that you remain mindful, stay calm, and try to focus on only what you are doing, without worrying about your performance. Second, I suggest that you pursue mental health treatment, such as counseling or consultation with a psychiatrist for medications.   Third, you reported that you were taken off your Klonopin, you have a history of anxiety, and you think that if anything your issues are worse since getting off that medication. This could also be a contributing factor if anxiety is a primary cause of your difficulties. You may consider consulting with psychiatry to have a specialist manage your psychiatric medications.   Finally, there could be some primary neurologic contribution to the issues you are noticing, whether your symptoms are due to MS or something else, but you will need to follow up with Dr. Krista Blue for definitive diagnosis of your underlying condition. I am not a neurologist and I do not diagnose MS.   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 I would encourage you to work with HR at your current job, because there may be assistance, programs, or other resources available for your given your mild neurocognitive disorder diagnosis.     Test Findings  Test scores are summarized in additional documentation associated with this encounter. Test scores are relative to age, gender, and educational history as available and appropriate. There were minor concerns about performance validity with findings on two standalone measures below expectations. She performed within normal limits on another standalone measure and on two embedded measures. In the context of her overall test performance, these are viewed as tempering as opposed to frankly invalidating her test data.   General Intellectual Functioning/Achievement:  Performance on single word reading was low average. On stable measures of intellectual abilities, she performed at a low average level overall. Performance was toward the low end of the average range on more visually oriented tasks, whereas her performance on more verbally oriented tasks was unusually low. This includes on measures of vocabulary knowledge and fund of information, which are amongst the most resistant to acquired impairment. Low average thus presents as a reasonable standard of comparison for her cognitive test performance.   Attention and Processing Efficiency: Performance on the Working Memory Index of  the WAIS-IV was unusally low, which may possibly represent some decline for this patient. She demonstrated unusually low performance on digit repetition forward, backward, and mental solving of arithmetical word problems without paper and pencil. She performed at an average level on resequencing digits in ascending order.   Processing efficiency and speed were at the margin of the unusually low and low average ranges on the Processing Speed Index from the  WAIS-IV. Performance was unusually low on a symbol-matching to sample task. Timed number-symbol coding was low average. She performed at an unusually low level compared to her age and education cohort on simple numeric sequencing.   Language: Language findings showed marginal performance. On stable measures of verbal abilities including vocabulary knowledge and fund of information, she scored in the unusually low range. There are not measures that are typically susceptible to cognitive decline apart from in a dementia level problem. She performed in the low-average range when identifying similarities between seemingly dissimilar things, which has an added reasoning/abstraction component in addition to language demands. Performance on sensitive indicators of verbal fluency was unusually low, with comparable scores for phonemic and semantic fluency.   Visuospatial Function: Performance on measures of perceptual reasoning abilities represented an area of strength for this patient, with a score toward the lower extreme of the average range. Colored block assembly was unusually low. By contrast, she performed at an average level when solving a series of visual puzzles and on a matrix reasoning task involving fluid intelligence and visual analysis/synthesis/processing.   Learning and Memory: Performance on memory measures fell at an extremely low level overall. The number of low subtest scores is unusual for a person of her measured intelligence; however, when scored using a GED level education her performance is entirely within normal limits. This suggests that some of the low scores may be on the basis of artifact related to demographic correction.   Performance on verbally mediated indicators of memory abilities showed extremely low word list learning with 3, 6, and 7 words of a 12-item list across three learning trials. She retained 6 words on short delayed recall, which is unusually low. Following a  standard delay, retention was good, and she recalled 5 words for an unusually low long delayed recall score. She did well when presented with recognition cues with an average discriminability score identifying target words from the list versus foils. Short story learning was extremely low on immediate recall and delayed recall but her retention of information across time was good. Memory for brief daily living type information was low average on immediate recall and delayed recall. Recognition discriminability for the information was high average with errorless performance.   In the visual realm, she demonstrated extremely low immediate recall of a series of designs that are difficult to encode, but then her retention of information across time was very good and she had average performance on delayed recognition. Yes/no recognition for designs versus false choices was also good with errorless performance and average discriminability.   Executive Functions: Performance on executive measures was middling, with unusually low generation of words in response to the letters F-A-S, unusually low alternating sequencing of numbers and letters of the alphabet, and unusually low performance on the [employer] Composite of the [employer].   Rating Scale(s): Ms. Gadbois reported moderate levels of depressive symptoms and severe levels of anxiety symptoms.   Viviano Simas Nicole Kindred PsyD, ABN Clinical Neuropsychologist  Coding and Compliance  Billing below reflects technician time, my  direct face-to-face time with the patient, time spent in test administration, and time spent in professional activities including but not limited to: neuropsychological test interpretation, integration of neuropsychological test data with clinical history, report preparation, treatment planning, care coordination, and review of diagnostically pertinent medical history or studies.   Services associated with  this encounter: Clinical Interview [phone]) plus 215 minutes (96132/96133; Neuropsychological Evaluation by Professional)  150 minutes ([id number]/[id number]; Neuropsychological Testing by Technician)

## 2021-02-23 ENCOUNTER — Encounter: Payer: Self-pay | Admitting: Counselor

## 2021-02-23 ENCOUNTER — Other Ambulatory Visit: Payer: Self-pay

## 2021-02-23 ENCOUNTER — Ambulatory Visit (INDEPENDENT_AMBULATORY_CARE_PROVIDER_SITE_OTHER): Payer: BC Managed Care – PPO | Admitting: Counselor

## 2021-02-23 DIAGNOSIS — G3184 Mild cognitive impairment, so stated: Secondary | ICD-10-CM | POA: Diagnosis not present

## 2021-02-23 DIAGNOSIS — F418 Other specified anxiety disorders: Secondary | ICD-10-CM | POA: Diagnosis not present
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
## 2021-02-23 NOTE — Patient Instructions (Signed)
Your performance and presentation on neuropsychological assessment was consistent with marginal performance validity. Just like a patient must lie still when undergoing an Xray for the picture to be interpretable, there are a set of assumptions that must be met for neuropsychological test findings to be of meaning. Your performance on validity measures suggests that we cannot be entirely sure that was the case. Validity problems can be caused by any number of things such as fatigue, difficulty maintaining attention, sensory impairments, idiosyncratic approaches to test taking, or misunderstanding test instructions. In your case, I think that suboptimal performance validity tempers but does not entirely invalidates interpretation of your test findings.   Prior to ascertaining whether a decline in cognition has occurred, it is important to figure out where someone likely started out. We do this by using tests that are typically resistant to cognitive impairment, such as ability to read words, vocabulary, fund of information etc. Like all individuals, you have a pattern of natural cognitive strengths and weaknesses. There is some evidence that you may be a more visual than verbal person, which is important to consider because the difficulties you have on the job are primarily with verbal tasks. This is not a difference that is likely acquired, it is simply the way your brain is wired, the result of your learning history, academic experiences and the like. It thus stands to reason that your preexisting strengths and weaknesses may make it more likely for you to have difficulty with these sorts of things even if they are not the only cause of your difficulties.  The above considerations notwithstanding, I do think it is possible that there are some mild declines in working memory (e.g., ability to hold information "online" and do something with it) and also with respect to memory encoding (I.e., getting information  in). These difficulties go well with the day-to-day problems that you are noticing, which involve difficulties with attention and concentration, errors completing highly detail oriented paperwork, and also difficulty composing your thoughts. I am not entirely sure what is causing these problems but think that they are most likely multifactorial.   First, you reported that you are only getting 4.5 hours of sleep and previously were getting 2.5 hours of sleep. Sleep deprivation has multiple acute and cumulative effects on brain performance and can be extremely undermining to one's day-to-day functioning. I would suggest that you try sleep hygiene strategies, consult with a sleep specialist, or consider behavioral sleep medicine for a full and complete workup of your sleep issues. The following sleep hygiene strategies may be helpful:   There are few things as disruptive to brain functioning as not getting a good night's sleep. For sleep, I recommend against using medications, which can have lingering sedating effects on the brain and rob your brain of restful REM sleep. Instead, consider trying some of the following sleep hygiene recommendations. They may not work at once and may take effort, but the effort you spend is likely to be rewarded with better sleep eventually:   Stick to a sleep schedule of the same bedtime and wake time even on the weekends, which can help to regulate your body's internal clock so that you fall asleep and stay asleep.   Practice a relaxing bedtime ritual (conducted away from bright lights) which will help separate your sleep from stimulating activities and prepare your body to fall asleep when you go to bed.   Avoid naps, especially in the afternoon.   Evaluate your room and create conditions  that will promote sleep such as keeping it cool (between 60 - 67 degrees), quiet, and free from any lights. Consider using blackout curtains, a "white noise" generator, or fan that  will help mask any noises that might prevent you from going to sleep or awaken you during the night.   Sleep on a comfortable mattress and pillows.   Avoid bright light in the evening and excessive use of portable electronic devices right before bed that may contain light frequencies that can contribute to sleep problems.   Avoid alcohol, cigarettes, or heavy meals in the evening. If you must eat, consume a light snack 45 minutes before bed.   Use your bed only for sleep to strengthen the association between your bed and sleep.   If you can't go to sleep within 30 minutes, go into another room and do something relaxing until you feel tired. Then, come back and try to go to sleep again for 30 minutes and repeat until sleep is achieved.   Some people find over the counter melatonin to be helpful for sleep, which you could discuss with a pharmacist or prescribing provider.   Second, you reported moderate levels of depressive symptoms and severe levels of anxiety symptoms. Be aware that there is a bidirectional relationship between anxiety, depression, and cognitive performance. Although your anxiety and depression may have been exacerbated by your difficulties at work, they are also now likely making it harder for you to perform on the job. Anxiety and depression distract one from the task at hand. They also can promote overfocus on and concern about cognitive performance that then takes cognitive resources away from the task at hand and makes performance errors more likely. First, I suggest that you remain mindful, stay calm, and try to focus on only what you are doing, without worrying about your performance. Second, I suggest that you pursue mental health treatment, such as counseling or consultation with a psychiatrist for medications.   Third, you reported that you were taken off your Klonopin, you have a history of anxiety, and you think that if anything your issues are worse since getting off  that medication. This could also be a contributing factor if anxiety is a primary cause of your difficulties. You may consider consulting with psychiatry to have a specialist manage your psychiatric medications.   Finally, there could be some primary neurologic contribution to the issues you are noticing, whether your symptoms are due to MS or something else, but you will need to follow up with Dr. Krista Blue for definitive diagnosis of your underlying condition. I am not a neurologist and I do not diagnose MS.   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 I would encourage you to work with HR at your current job, because there may be assistance, programs, or other resources available for your given your mild neurocognitive disorder diagnosis.

## 2021-07-02 ENCOUNTER — Other Ambulatory Visit: Payer: Self-pay | Admitting: Neurology

## 2021-07-10 ENCOUNTER — Other Ambulatory Visit: Payer: Self-pay

## 2021-07-10 ENCOUNTER — Emergency Department (HOSPITAL_COMMUNITY)
Admission: EM | Admit: 2021-07-10 | Discharge: 2021-07-11 | Disposition: A | Discharge status: Home / Self Care | Payer: BC Managed Care – PPO | Attending: Emergency Medicine | Admitting: Emergency Medicine

## 2021-07-10 ENCOUNTER — Encounter (HOSPITAL_COMMUNITY): Payer: Self-pay | Admitting: Emergency Medicine

## 2021-07-10 DIAGNOSIS — Z7982 Long term (current) use of aspirin: Secondary | ICD-10-CM | POA: Diagnosis not present

## 2021-07-10 DIAGNOSIS — N9489 Other specified conditions associated with female genital organs and menstrual cycle: Secondary | ICD-10-CM | POA: Diagnosis not present

## 2021-07-10 DIAGNOSIS — L03311 Cellulitis of abdominal wall: Secondary | ICD-10-CM | POA: Diagnosis not present

## 2021-07-10 DIAGNOSIS — R109 Unspecified abdominal pain: Secondary | ICD-10-CM | POA: Diagnosis present

## 2021-07-10 DIAGNOSIS — R110 Nausea: Secondary | ICD-10-CM | POA: Diagnosis not present

## 2021-07-10 DIAGNOSIS — Z7984 Long term (current) use of oral hypoglycemic drugs: Secondary | ICD-10-CM | POA: Insufficient documentation

## 2021-07-10 DIAGNOSIS — L02211 Cutaneous abscess of abdominal wall: Secondary | ICD-10-CM | POA: Insufficient documentation

## 2021-07-10 DIAGNOSIS — Z87891 Personal history of nicotine dependence: Secondary | ICD-10-CM | POA: Diagnosis not present

## 2021-07-10 DIAGNOSIS — E119 Type 2 diabetes mellitus without complications: Secondary | ICD-10-CM | POA: Diagnosis not present

## 2021-07-10 DIAGNOSIS — L0291 Cutaneous abscess, unspecified: Secondary | ICD-10-CM

## 2021-07-10 LAB — COMPREHENSIVE METABOLIC PANEL
ALT: 29 U/L (ref 0–44)
AST: 25 U/L (ref 15–41)
Albumin: 3.9 g/dL (ref 3.5–5.0)
Alkaline Phosphatase: 132 U/L — ABNORMAL HIGH (ref 38–126)
Anion gap: 12 (ref 5–15)
BUN: 13 mg/dL (ref 6–20)
CO2: 24 mmol/L (ref 22–32)
Calcium: 9 mg/dL (ref 8.9–10.3)
Chloride: 99 mmol/L (ref 98–111)
Creatinine, Ser: 0.71 mg/dL (ref 0.44–1.00)
GFR, Estimated: >60 mL/min (ref >60)
Glucose, Bld: 431 mg/dL — ABNORMAL HIGH (ref 70–99)
Potassium: 4 mmol/L (ref 3.5–5.1)
Sodium: 135 mmol/L (ref 135–145)
Total Bilirubin: 0.6 mg/dL (ref 0.3–1.2)
Total Protein: 7.1 g/dL (ref 6.5–8.1)

## 2021-07-10 LAB — CBC
HCT: 42.4 % (ref 36.0–46.0)
Hemoglobin: 13.8 g/dL (ref 12.0–15.0)
MCH: 29.1 pg (ref 26.0–34.0)
MCHC: 32.5 g/dL (ref 30.0–36.0)
MCV: 89.5 fL (ref 80.0–100.0)
Platelets: 348 10*3/uL (ref 150–400)
RBC: 4.74 MIL/uL (ref 3.87–5.11)
RDW: 13.7 % (ref 11.5–15.5)
WBC: 10.4 10*3/uL (ref 4.0–10.5)
nRBC: 0 % (ref 0.0–0.2)

## 2021-07-10 LAB — URINALYSIS, ROUTINE W REFLEX MICROSCOPIC
Bacteria, UA: NONE SEEN
Bilirubin Urine: NEGATIVE
Glucose, UA: >=1000 mg/dL
Hgb urine dipstick: NEGATIVE
Leukocytes,Ua: NEGATIVE
Nitrite: NEGATIVE
Protein, ur: NEGATIVE mg/dL
Specific Gravity, Urine: 1.01 (ref 1.005–1.030)
pH: 6 (ref 5.0–8.0)

## 2021-07-10 LAB — CBG MONITORING, ED: Glucose-Capillary: 325 mg/dL — ABNORMAL HIGH (ref 70–99)

## 2021-07-10 LAB — I-STAT BETA HCG BLOOD, ED (MC, WL, AP ONLY): I-stat hCG, quantitative: <5 m[IU]/mL (ref <5)

## 2021-07-10 LAB — LIPASE, BLOOD: Lipase: 35 U/L (ref 11–51)

## 2021-07-10 MED ORDER — LACTATED RINGERS IV BOLUS
1000.0000 mL | Freq: Once | INTRAVENOUS | Status: AC
  Administered 2021-07-10: 1000 mL via INTRAVENOUS

## 2021-07-10 MED ORDER — HYDROCODONE-ACETAMINOPHEN 5-325 MG PO TABS
1.0000 | ORAL_TABLET | Freq: Once | ORAL | Status: AC
  Administered 2021-07-10: 1 via ORAL
  Filled 2021-07-10: qty 1

## 2021-07-10 MED ORDER — LIDOCAINE-EPINEPHRINE (PF) 2 %-1:200000 IJ SOLN
10.0000 mL | Freq: Once | INTRAMUSCULAR | Status: AC
  Administered 2021-07-10: 10 mL via INTRADERMAL
  Filled 2021-07-10: qty 20

## 2021-07-10 MED ORDER — INSULIN GLARGINE-YFGN 100 UNIT/ML ~~LOC~~ SOLN
10.0000 [IU] | Freq: Once | SUBCUTANEOUS | Status: AC
  Administered 2021-07-11: 10 [IU] via SUBCUTANEOUS
  Filled 2021-07-10: qty 0.1

## 2021-07-10 MED ORDER — INSULIN ASPART PROT & ASPART (70-30 MIX) 100 UNIT/ML ~~LOC~~ SUSP: 6.0000 [IU] | Freq: Once | SUBCUTANEOUS | Status: DC

## 2021-07-10 MED ORDER — SULFAMETHOXAZOLE-TRIMETHOPRIM 800-160 MG PO TABS: 2.0000 | ORAL_TABLET | Freq: Two times a day (BID) | ORAL | 0 refills | Status: AC

## 2021-07-10 NOTE — ED Provider Notes (Signed)
Emergency Medicine Provider Triage Evaluation Note  Joanne Reed , a 50 y.o. female  was evaluated in triage.  Pt complains of boil to abdomen for 1 day.  History of diabetes.  Had similar symptoms a few years back.  Review of Systems  Positive: Tenderness to surrounding area, chills, nausea Negative: Urinary symptoms.  Physical Exam  BP (!) 155/107 (BP Location: Right Arm)   Pulse (!) 107   Temp 98.3 F (36.8 C) (Oral)   Resp 18   Ht 5\' 4"  (1.626 m)   Wt 102.1 kg   SpO2 93%   BMI 38.62 kg/m  Gen:   Awake, no distress  Resp:  Normal effort  MSK:   Moves extremities without difficulty  Other:  Abscess noted to lower abdomen.  No weeping  Medical Decision Making  Medically screening exam initiated at 8:02 PM.  Appropriate orders placed.  Joanne Reed was informed that the remainder of the evaluation will be completed by another provider, this initial triage assessment does not replace that evaluation, and the importance of remaining in the ED until their evaluation is complete.  I&D   Darliss Ridgel 07/10/21 2004    Arnaldo Natal, MD 07/10/21 2133
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
## 2021-07-10 NOTE — ED Triage Notes (Signed)
Reports lower abdominal pain and a "small knot" in pelvic area x1 day. Also reports nausea, no episodes of vomiting. Reports taking advil yesterday w/ no relief. Denies urinary symptoms. Hx DM2 and MS.

## 2021-08-27 ENCOUNTER — Other Ambulatory Visit: Payer: Self-pay | Admitting: Neurology

## 2021-10-07 ENCOUNTER — Other Ambulatory Visit: Payer: Self-pay

## 2021-10-07 ENCOUNTER — Ambulatory Visit (AMBULATORY_SURGERY_CENTER): Payer: BC Managed Care – PPO | Admitting: *Deleted

## 2021-10-07 VITALS — Ht 64.0 in | Wt 226.0 lb

## 2021-10-07 DIAGNOSIS — Z1211 Encounter for screening for malignant neoplasm of colon: Secondary | ICD-10-CM

## 2021-10-07 MED ORDER — NA SULFATE-K SULFATE-MG SULF 17.5-3.13-1.6 GM/177ML PO SOLN: 1.0000 | ORAL | 0 refills | Status: DC

## 2021-10-07 NOTE — Progress Notes (Signed)
Patient's pre-visit was done today over the phone with the patient. Name,DOB and address verified. Patient denies any allergies to Eggs and Soy. Patient denies any problems with anesthesia/sedation. Patient is not taking any diet pills or blood thinners. No home Oxygen. Packet of Prep instructions mailed to patient including a copy of a consent form-pt is aware. Prep instructions sent to pt's MyChart (if activated).Patient understands to call us back with any questions or concerns. Patient is aware of our care-partner policy and [id number] safety protocol.   EMMI education assigned to the patient for the procedure, sent to [facility].

## 2021-10-17 HISTORY — PX: COLONOSCOPY: SHX174

## 2021-10-20 ENCOUNTER — Encounter: Payer: Self-pay | Admitting: Gastroenterology

## 2021-10-25 ENCOUNTER — Encounter: Payer: Self-pay | Admitting: Gastroenterology

## 2021-10-25 ENCOUNTER — Ambulatory Visit (AMBULATORY_SURGERY_CENTER): Payer: BC Managed Care – PPO | Admitting: Gastroenterology

## 2021-10-25 ENCOUNTER — Other Ambulatory Visit: Payer: Self-pay

## 2021-10-25 VITALS — BP 161/83 | HR 62 | Temp 97.5°F | Resp 13 | Ht 64.0 in | Wt 226.0 lb

## 2021-10-25 DIAGNOSIS — Z1211 Encounter for screening for malignant neoplasm of colon: Secondary | ICD-10-CM

## 2021-10-25 DIAGNOSIS — D124 Benign neoplasm of descending colon: Secondary | ICD-10-CM | POA: Diagnosis not present

## 2021-10-25 DIAGNOSIS — D125 Benign neoplasm of sigmoid colon: Secondary | ICD-10-CM

## 2021-10-25 MED ORDER — SODIUM CHLORIDE 0.9 % IV SOLN: 500.0000 mL | Freq: Once | INTRAVENOUS | Status: DC

## 2021-10-25 NOTE — Progress Notes (Signed)
Vitals-DT  Pt's states no medical or surgical changes since previsit or office visit.  

## 2021-10-25 NOTE — Patient Instructions (Signed)
Handout on polyps and Hemorrhoids provided.  Await pathology report.  Resume previou medications and diet.  YOU HAD AN ENDOSCOPIC PROCEDURE TODAY AT [facility]:   Refer to the procedure report that was given to you for any specific questions about what was found during the examination.  If the procedure report does not answer your questions, please call your gastroenterologist to clarify.  If you requested that your care partner not be given the details of your procedure findings, then the procedure report has been included in a sealed envelope for you to review at your convenience later.  YOU SHOULD EXPECT: Some feelings of bloating in the abdomen. Passage of more gas than usual.  Walking can help get rid of the air that was put into your GI tract during the procedure and reduce the bloating. If you had a lower endoscopy (such as a colonoscopy or flexible sigmoidoscopy) you may notice spotting of blood in your stool or on the toilet paper. If you underwent a bowel prep for your procedure, you may not have a normal bowel movement for a few days.  Please Note:  You might notice some irritation and congestion in your nose or some drainage.  This is from the oxygen used during your procedure.  There is no need for concern and it should clear up in a day or so.  SYMPTOMS TO REPORT IMMEDIATELY:  Following lower endoscopy (colonoscopy or flexible sigmoidoscopy):  Excessive amounts of blood in the stool  Significant tenderness or worsening of abdominal pains  Swelling of the abdomen that is new, acute  Fever of 100F or higher   For urgent or emergent issues, a gastroenterologist can be reached at any hour by calling [phone]. Do not use MyChart messaging for urgent concerns.    DIET:  We do recommend a small meal at first, but then you may proceed to your regular diet.  Drink plenty of fluids but you should avoid alcoholic beverages for 24 hours.  ACTIVITY:  You should  plan to take it easy for the rest of today and you should NOT DRIVE or use heavy machinery until tomorrow (because of the sedation medicines used during the test).    FOLLOW UP: Our staff will call the number listed on your records 48-72 hours following your procedure to check on you and address any questions or concerns that you may have regarding the information given to you following your procedure. If we do not reach you, we will leave a message.  We will attempt to reach you two times.  During this call, we will ask if you have developed any symptoms of COVID 19. If you develop any symptoms (ie: fever, flu-like symptoms, shortness of breath, cough etc.) before then, please call [phone].  If you test positive for Covid 19 in the 2 weeks post procedure, please call and report this information to [place].    If any biopsies were taken you will be contacted by phone or by letter within the next 1-3 weeks.  Please call us at [phone] if you have not heard about the biopsies in 3 weeks.    SIGNATURES/CONFIDENTIALITY: You and/or your care partner have signed paperwork which will be entered into your electronic medical record.  These signatures attest to the fact that that the information above on your After Visit Summary has been reviewed and is understood.  Full responsibility of the confidentiality of this discharge information lies with you and/or your care-partner.

## 2021-10-25 NOTE — Op Note (Signed)
[facility] Patient Name: Joanne Reed Procedure Date: 10/25/2021 9:15 AM MRN: [id number] Endoscopist: Jackquline Denmark , MD Age: 51 Referring MD:  Date of Birth: 01-13-1971 Gender: Female Account #: [id number] Procedure:                Colonoscopy Indications:              Screening for colorectal malignant neoplasm Medicines:                Monitored Anesthesia Care Procedure:                Pre-Anesthesia Assessment:                           - Prior to the procedure, a History and Physical                            was performed, and patient medications and                            allergies were reviewed. The patient's tolerance of                            previous anesthesia was also reviewed. The risks                            and benefits of the procedure and the sedation                            options and risks were discussed with the patient.                            All questions were answered, and informed consent                            was obtained. Prior Anticoagulants: The patient has                            taken no previous anticoagulant or antiplatelet                            agents. ASA Grade Assessment: II - A patient with                            mild systemic disease. After reviewing the risks                            and benefits, the patient was deemed in                            satisfactory condition to undergo the procedure.                           After obtaining informed consent, the colonoscope  was passed under direct vision. Throughout the                            procedure, the patient's blood pressure, pulse, and                            oxygen saturations were monitored continuously. The                            Olympus PCF-H190DL (#[id number]) Colonoscope was                            introduced through the anus and advanced to the the                            cecum, identified by  appendiceal orifice and                            ileocecal valve. The colonoscopy was performed                            without difficulty. The patient tolerated the                            procedure well. The quality of the bowel                            preparation was good. The ileocecal valve,                            appendiceal orifice, and rectum were photographed. Scope In: 9:30:06 AM Scope Out: 9:49:57 AM Scope Withdrawal Time: 0 hours 15 minutes 14 seconds  Total Procedure Duration: 0 hours 19 minutes 51 seconds  Findings:                 Three sessile polyps were found in the proximal                            sigmoid colon and mid descending colon. The polyps                            were 4 to 6 mm in size. These polyps were removed                            with a cold snare. Resection and retrieval were                            complete.                           Non-bleeding internal hemorrhoids were found during                            retroflexion. The hemorrhoids were mild, small and  Grade I (internal hemorrhoids that do not prolapse).                           The exam was otherwise without abnormality on                            direct and retroflexion views. Complications:            No immediate complications. Estimated Blood Loss:     Estimated blood loss: none. Impression:               - Three 4 to 6 mm polyps in the proximal sigmoid                            colon and in the mid descending colon, removed with                            a cold snare. Resected and retrieved.                           - Non-bleeding internal hemorrhoids.                           - The examination was otherwise normal on direct                            and retroflexion views. Recommendation:           - Patient has a contact number available for                            emergencies. The signs and symptoms of potential                             delayed complications were discussed with the                            patient. Return to normal activities tomorrow.                            Written discharge instructions were provided to the                            patient.                           - Resume previous diet.                           - Continue present medications.                           - Await pathology results.                           - Repeat colonoscopy for surveillance based on  pathology results.                           - The findings and recommendations were discussed                            with the designated responsible adult. Jackquline Denmark, MD 10/25/2021 9:53:55 AM This report has been signed electronically.

## 2021-10-25 NOTE — Progress Notes (Signed)
Called to room to assist during endoscopic procedure.  Patient ID and intended procedure confirmed with present staff. Received instructions for my participation in the procedure from the performing physician.  

## 2021-10-25 NOTE — Progress Notes (Signed)
PT taken to PACU. Monitors in place. VSS. Report given to RN. 

## 2021-10-25 NOTE — Progress Notes (Signed)
[facility] History and Physical   Primary Care Physician:  Enid Skeens., MD   Reason for Procedure:   Colorectal cancer screening  Plan:     colonoscopy     HPI: Joanne Reed is a 51 y.o. female    colonoscopy for colorectal cancer screening Past Medical History:  Diagnosis Date   Abnormal finding on MRI of brain 01/01/2020   Anxiety    Diabetes mellitus without complication ([place])    Hypercholesteremia    Hyperlipidemia    Migraine    MS (multiple sclerosis) ([place])    Thyromegaly 09/20/2013   TIA (transient ischemic attack) 2021   ?   Uncontrolled type 2 diabetes mellitus with diabetic polyneuropathy, without long-term current use of insulin 07/10/2017    Past Surgical History:  Procedure Laterality Date   ABDOMINAL HYSTERECTOMY  2009   [facility]--Dr. Gloster   cholecystectomy  2000   COLONOSCOPY  11/14/2007   Dr.Jazel Nimmons    Prior to Admission medications   Medication Sig Start Date End Date Taking? Authorizing Provider  aspirin EC 81 MG tablet Take 81 mg by mouth daily.   Yes [provider]  atorvastatin (LIPITOR) 20 MG tablet Take 20 mg by mouth daily.   Yes [provider]  DULoxetine (CYMBALTA) 60 MG capsule TAKE ONE CAPSULE BY MOUTH EVERY DAY 07/05/21  Yes Marcial Pacas, MD  glipiZIDE (GLUCOTROL XL) 10 MG 24 hr tablet Take 10 mg by mouth 2 (two) times daily. 10/02/21  Yes [provider]  metFORMIN (GLUCOPHAGE-XR) 500 MG 24 hr tablet Take 500 mg by mouth 5 (five) times daily. 01/29/20  Yes [provider]  traZODone (DESYREL) 50 MG tablet TAKE TWO TABLETS BY MOUTH (100MG ) AT BEDTIME 08/30/21  Yes Marcial Pacas, MD  ALPRAZolam Duanne Moron) 1 MG tablet Take 1-2 tablets thirty minutes prior to MRI.  May take one additional tablet before entering scanner, if needed.  MUST HAVE DRIVER. Patient not taking: Reported on 10/07/2021 12/23/20   Marcial Pacas, MD  VITAMIN E PO Take 1 tablet by mouth daily.     [provider]    Current Outpatient Medications  Medication Sig Dispense Refill   aspirin EC 81 MG tablet Take 81 mg by mouth daily.     atorvastatin (LIPITOR) 20 MG tablet Take 20 mg by mouth daily.     DULoxetine (CYMBALTA) 60 MG capsule TAKE ONE CAPSULE BY MOUTH EVERY DAY 90 capsule 4   glipiZIDE (GLUCOTROL XL) 10 MG 24 hr tablet Take 10 mg by mouth 2 (two) times daily.     metFORMIN (GLUCOPHAGE-XR) 500 MG 24 hr tablet Take 500 mg by mouth 5 (five) times daily.     traZODone (DESYREL) 50 MG tablet TAKE TWO TABLETS BY MOUTH (100MG ) AT BEDTIME 60 tablet 4   ALPRAZolam (XANAX) 1 MG tablet Take 1-2 tablets thirty minutes prior to MRI.  May take one additional tablet before entering scanner, if needed.  MUST HAVE DRIVER. (Patient not taking: Reported on 10/07/2021) 3 tablet 0   VITAMIN E PO Take 1 tablet by mouth daily.     Current Facility-Administered Medications  Medication Dose Route Frequency Provider Last Rate Last Admin   0.9 %  sodium chloride infusion  500 mL Intravenous Once Jackquline Denmark, MD        Allergies as of 10/25/2021   (No Known Allergies)    Family History  Problem Relation Age of Onset   Kidney cancer  Mother    Skin cancer Mother        unsure of type - not melanoma - place removed from nose.   Esophageal cancer Father    Hypertension Brother    Diabetes Paternal Grandmother    Colon cancer Neg Hx    Colon polyps Neg Hx    Rectal cancer Neg Hx    Stomach cancer Neg Hx     Social History   Socioeconomic History   Marital status: Married    Spouse name: Not on file   Number of children: 3   Years of education: college   Highest education level: Not on file  Occupational History   Occupation: Teacher  Tobacco Use   Smoking status: Former   Smokeless tobacco: Never   Tobacco comments:    01/01/20 - quit 22 years ago  Vaping Use   Vaping Use: Never used  Substance and Sexual Activity   Alcohol use: Not Currently    Comment: occasional    Drug use: Never   Sexual activity: Yes    Partners: Male    Birth control/protection: None, Surgical    Comment: R-[facility]  Other Topics Concern   Not on file  Social History Narrative   Lives at home husband.   Right-handed.   Caffeine use: 20 oz of green tea daily.   Social Determinants of Health   Financial Resource Strain: Not on file  Food Insecurity: Not on file  Transportation Needs: Not on file  Physical Activity: Not on file  Stress: Not on file  Social Connections: Not on file  Intimate Partner Violence: Not on file    Review of Systems: Positive for none All other review of systems negative except as mentioned in the HPI.  Physical Exam: Vital signs in last 24 hours: @VSRANGES @   General:   Alert,  Well-developed, well-nourished, pleasant and cooperative in NAD Lungs:  Clear throughout to auscultation.   Heart:  Regular rate and rhythm; no murmurs, clicks, rubs,  or gallops. Abdomen:  Soft, nontender and nondistended. Normal bowel sounds.   Neuro/Psych:  Alert and cooperative. Normal mood and affect. A and O x 3    No significant changes were identified.  The patient continues to be an appropriate candidate for the planned procedure and anesthesia.   Carmell Austria, MD. [facility] 10/25/2021 9:25 AM@

## 2021-10-27 ENCOUNTER — Telehealth: Payer: Self-pay | Admitting: *Deleted

## 2021-10-27 NOTE — Telephone Encounter (Signed)
°  Follow up Call-  Call back number 10/25/2021  Post procedure Call Back phone  # [phone]  Permission to leave phone message Yes  Some recent data might be hidden     Patient questions:  Do you have a fever, pain , or abdominal swelling? No. Pain Score  0 *  Have you tolerated food without any problems? Yes.    Have you been able to return to your normal activities? Yes.    Do you have any questions about your discharge instructions: Diet   No. Medications  No. Follow up visit  No.  Do you have questions or concerns about your Care? No.  Actions: * If pain score is 4 or above: No action needed, pain <4.

## 2021-10-31 ENCOUNTER — Encounter: Payer: Self-pay | Admitting: Gastroenterology

## 2021-12-15 ENCOUNTER — Ambulatory Visit: Payer: BC Managed Care – PPO | Admitting: Nurse Practitioner

## 2021-12-15 ENCOUNTER — Encounter: Payer: Self-pay | Admitting: Nurse Practitioner

## 2021-12-15 ENCOUNTER — Other Ambulatory Visit: Payer: BC Managed Care – PPO

## 2021-12-15 VITALS — BP 132/82 | HR 68 | Ht 63.0 in | Wt 214.4 lb

## 2021-12-15 DIAGNOSIS — R194 Change in bowel habit: Secondary | ICD-10-CM

## 2021-12-15 DIAGNOSIS — R110 Nausea: Secondary | ICD-10-CM

## 2021-12-15 DIAGNOSIS — R1031 Right lower quadrant pain: Secondary | ICD-10-CM | POA: Diagnosis not present

## 2021-12-15 MED ORDER — DICYCLOMINE HCL 10 MG PO CAPS: 10.0000 mg | ORAL_CAPSULE | Freq: Two times a day (BID) | ORAL | 1 refills | Status: DC

## 2021-12-15 NOTE — Progress Notes (Addendum)
? ? ? ?ASSESSMENT AND PLAN   ? ?# 51 yo female with 4 week history of nausea, generalized abdominal pain which is worse in RLQ and bowel changes. Having increased frequency of unformed stool. Apparently CT AP by PCP was negative. Sent for surgical evaluation ( [facility]) and to evaluate for mesenteric ischemia she was sent for a CTA. No results in [place] but surgery subsequently treated her with antibiotics for something. Still having nausea and pain, only ~ 30 % better after antibiotics. Etiology unclear, ? Infectious diarrhea ?--Stool for gi path panel  ?--get copy of CTA report from [facility].  ?--Will call patient with results of above ?--Trial of Bentyl 20 mg twice daily as needed in the meantime, # 60, 1 refill.  ?--Uneventful colonoscopy early January 2023 - three small polyps removed via cold snare.  ? ?ADDENDUM: Received CTA abdomen and pelvis with contrast from [facility] radiology. Performed 11/30/21  Appendix appears normal.  Nondilated colon.  Redundant sigmoid.  Enteral contrast opacification of the mid through distal colon and rectum.  Mild asymmetric mucosal thickening involving the cecum and proximal ascending colon.  Intraluminal enhancement at sites of cecal and proximal ascending colon mucosal thickening is difficult to ascertain between ingested enteric contrast versus express position.  Burtis Junes the former. ? ?HISTORY OF PRESENT ILLNESS   ? ?Chief Complaint : abdominal pain and bowel changes.  ? ?Joanne Reed is a 51 y.o. female with a past medical history of DM, TIA, HLD, anxeity.  Additional medical history as listed in [place] .  ? ?Patient is known to Dr.  Lyndel Safe. Had a screening colonoscopy on 10/25/21 She did fine after the colonoscopy but 4 weeks ago she developed crampy generalized abdominal pain, greatest in RLQ pain. Pain is nearly constant. It is dull with intermittent sharp pain eating. Also having nausea and more frequent BMs . Having 3-5 BMs a day but typically has only 1 BM a day.  Stools are like "sludge" as opposed to being formed like usual. Saw PCP , CT scan at [facility] was negative. Referred to a Surgeon  [facility] ). He was concerned about mesenteric ischemia and ordered a CTA.  Antibiotics then given for a "pocket of infection in bowel" per patient. Given Augment BID x 10 days. She completed them yesterday. She hasn't had any dietary of med changes to attribute the pain and bowel changes to. Her blood sugars have been running in the 200's which is atypical for her.  ? ? ?INTERVAL HISTORY:  ?She is still having nausea and the same generalized, dull abdominal discomfort which is worse in RLQ. Overall she is about 30 % better after antibiotics. Stools still unformed like sludge.   ? ?LABORATORY DATA ? ?Hepatic Function Latest Ref Rng & Units 07/10/2021 07/03/2020 03/30/2020  ?Total Protein 6.5 - 8.1 g/dL 7.1 6.7 6.7  ?Albumin 3.5 - 5.0 g/dL 3.9 3.6 -  ?AST 15 - 41 U/L 25 21 -  ?ALT 0 - 44 U/L 29 20 -  ?Alk Phosphatase 38 - 126 U/L 132(H) 96 -  ?Total Bilirubin 0.3 - 1.2 mg/dL 0.6 0.8 -  ? ? ?CBC Latest Ref Rng & Units 07/10/2021 07/04/2020 07/03/2020  ?WBC 4.0 - 10.5 K/uL 10.4 - 18.2(H)  ?Hemoglobin 12.0 - 15.0 g/dL 13.8 13.1 13.5  ?Hematocrit 36.0 - 46.0 % 42.4 40.6 41.9  ?Platelets 150 - 400 K/uL 348 - 378  ? ? ?Lab Results  ?Component Value Date  ? LIPASE 35 07/10/2021  ? ? ? ?  PREVIOUS ENDOSCOPIC EVALUATIONS   ? ?January 2023 Colonoscopy ?--3 sessile polyps removed from proximal sigmoid and mid descending colon, small internal hemorhroids ? ?Path - tubular adenomas ( 3) ? ?Past Medical History:  ?Diagnosis Date  ? Abnormal finding on MRI of brain 01/01/2020  ? Anxiety   ? Diabetes mellitus without complication ([place])   ? Hypercholesteremia   ? Hyperlipidemia   ? Migraine   ? MS (multiple sclerosis) ([place])   ? Thyromegaly 09/20/2013  ? TIA (transient ischemic attack) 2021  ? ?  ? Uncontrolled type 2 diabetes mellitus with diabetic polyneuropathy, without long-term current use of insulin  07/10/2017  ? ? ?Past Surgical History:  ?Procedure Laterality Date  ? ABDOMINAL HYSTERECTOMY  2009  ? [facility]--Dr. Quincy Simmonds  ? [place]  ? cholecystectomy  2000  ? COLONOSCOPY  11/14/2007  ? Dr.Gupta  ? ? ?Current Medications, Allergies, Family History and Social History were reviewed in [employer] record. ?  ?  ?Current Outpatient Medications  ?Medication Sig Dispense Refill  ? ALPRAZolam (XANAX) 1 MG tablet Take 1-2 tablets thirty minutes prior to MRI.  May take one additional tablet before entering scanner, if needed.  MUST HAVE DRIVER. 3 tablet 0  ? aspirin EC 81 MG tablet Take 81 mg by mouth daily.    ? atorvastatin (LIPITOR) 20 MG tablet Take 20 mg by mouth daily.    ? DULoxetine (CYMBALTA) 60 MG capsule TAKE ONE CAPSULE BY MOUTH EVERY DAY 90 capsule 4  ? glipiZIDE (GLUCOTROL XL) 10 MG 24 hr tablet Take 10 mg by mouth 2 (two) times daily.    ? metFORMIN (GLUCOPHAGE-XR) 500 MG 24 hr tablet Take 500 mg by mouth 5 (five) times daily.    ? traZODone (DESYREL) 50 MG tablet TAKE TWO TABLETS BY MOUTH (100MG) AT BEDTIME 60 tablet 4  ? VITAMIN E PO Take 1 tablet by mouth daily.    ? ?No current facility-administered medications for this visit.  ? ? ?Review of Systems: ?No chest pain. No shortness of breath. No urinary complaints.  ? ?PHYSICAL EXAM :   ? ?Wt Readings from Last 3 Encounters:  ?12/15/21 214 lb 6 oz (97.2 kg)  ?10/25/21 226 lb (102.5 kg)  ?10/07/21 226 lb (102.5 kg)  ? ? ?BP 132/82 (BP Location: Left Arm, Patient Position: Sitting, Cuff Size: Normal)   Pulse 68   Ht _0  (1.6 m) Comment: height measured without shoes  Wt 214 lb 6 oz (97.2 kg)   BMI 37.97 kg/m?  ?Constitutional:  Generally well appearing female in no acute distress. ?Psychiatric: Pleasant. Normal mood and affect. Behavior is normal. ?EENT: Pupils normal.  Conjunctivae are normal. No scleral icterus. ?Neck supple.  ?Cardiovascular: Normal rate, regular rhythm. No edema ?Pulmonary/chest: Effort  normal and breath sounds normal. No wheezing, rales or rhonchi. ?Abdominal: Soft, nondistended, nontender. Bowel sounds active throughout. There are no masses palpable. No hepatomegaly. ?Neurological: Alert and oriented to person place and time. ?Skin: Skin is warm and dry. No rashes noted. ? ?Joanne Savoy, NP  12/15/2021, 3:04 PM ? ?Cc:  ?Slatosky, Marshall Cork., MD ? ? ? ? ? ? ? ?

## 2021-12-15 NOTE — Patient Instructions (Signed)
Your provider has requested that you go to the basement level for lab work before leaving today. Press "B" on the elevator. The lab is located at the first door on the left as you exit the elevator. ? ?We have sent the following medications to your pharmacy for you to pick up at your convenience: ?Bentyl  ? ?If you are age 52 or older, your body mass index should be between 23-30. Your Body mass index is 37.97 kg/m?Marland Kitchen If this is out of the aforementioned range listed, please consider follow up with your Primary Care Provider. ? ? ?The [facility] providers would like to encourage you to use [facility] to communicate with providers for non-urgent requests or questions.  Due to long hold times on the telephone, sending your provider a message by [facility] may be a faster and more efficient way to get a response.  Please allow 48 business hours for a response.  Please remember that this is for non-urgent requests.  ?_______________________________________________________ ? ?Thank you for choosing me and [facility]. ? ?Tye Savoy NP  ? ? ?

## 2021-12-18 LAB — GI PROFILE, STOOL, PCR

## 2021-12-22 NOTE — Progress Notes (Signed)
Agree with the plan RG 

## 2021-12-29 ENCOUNTER — Telehealth: Payer: Self-pay

## 2021-12-29 NOTE — Telephone Encounter (Signed)
Patient returns my call. She said she has improved slightly and the Bentyl has helped.  ?She says she has swallowing issues almost daily but not every time she eats. She has not regurgitated anything. "I try to relax and that seems to help. It does create pain." She had a swallowing study at [facility] in ~2021. She has MS and assumed the swallowing issue was due to MS. ?

## 2021-12-29 NOTE — Telephone Encounter (Signed)
Called patient and left her a message to call back to discuss her progress. ?

## 2021-12-29 NOTE — Telephone Encounter (Signed)
-----   Message from Willia Craze, NP sent at 12/29/2021  9:14 AM EDT ----- ?Regarding: FW: ? ? ? ?Beth, will you see how she is doing. Stool studies negative. please see my office note, she was having some problems after colonoscopy. Dr. Lyndel Safe, has offered Bentyl to see if that would help if she is still having problems. Also, he mentioned that a surgeons note said she was having some problems swallowing. Can you ask her about that? Thanks. ?----- Message ----- ?From: Jackquline Denmark, MD ?Sent: 12/26/2021   5:43 PM EDT ?To: Willia Craze, NP ? ?CTA films reviewed. ?No mesenteric ischemia.  SMA is patent ?She was given empiric antibiotics -no significant benefit. ? ?Lets see how she does with Bentyl. ?Also per Ronalee Belts lininger's notes (surgery)-she was having dysphagia? ? ?RG ? ?----- Message ----- ?From: Willia Craze, NP ?Sent: 12/23/2021   2:09 PM EDT ?To: Jackquline Denmark, MD ? ?Hi, will you see my clinic note and tell what you think CT scan findings may mean. Developed issues a few weeks after colonoscopy. GI path panel negative.  ? ?Thanks ? ? ?

## 2021-12-31 NOTE — Telephone Encounter (Signed)
Spoke with the patient. Scheduled follow up for her with Dr Lyndel Safe. The swallowing test was done with Speech Therapy, but she does not remember any imaging for this test. I called the [facility]. No record of any swallowing studies by Radiology.  ?

## 2022-01-24 ENCOUNTER — Other Ambulatory Visit: Payer: Self-pay | Admitting: Neurology

## 2022-01-24 ENCOUNTER — Ambulatory Visit: Payer: BC Managed Care – PPO | Admitting: Neurology

## 2022-01-24 NOTE — Telephone Encounter (Signed)
Rx refilled.

## 2022-01-25 ENCOUNTER — Ambulatory Visit: Payer: BC Managed Care – PPO | Admitting: Gastroenterology

## 2022-01-25 ENCOUNTER — Encounter: Payer: Self-pay | Admitting: Gastroenterology

## 2022-01-25 VITALS — BP 116/80 | HR 90 | Ht 63.0 in | Wt 210.0 lb

## 2022-01-25 DIAGNOSIS — R1319 Other dysphagia: Secondary | ICD-10-CM

## 2022-01-25 DIAGNOSIS — K219 Gastro-esophageal reflux disease without esophagitis: Secondary | ICD-10-CM

## 2022-01-25 MED ORDER — PANTOPRAZOLE SODIUM 40 MG PO TBEC: 40.0000 mg | DELAYED_RELEASE_TABLET | Freq: Every day | ORAL | 3 refills | Status: DC

## 2022-01-25 NOTE — Progress Notes (Signed)
? ? ?Chief Complaint:  ? ?Referring Provider:  Enid Skeens., MD    ? ? ?ASSESSMENT AND PLAN;  ? ?#1. GERD with eso dysphagia. D/d includes eso stricture, Schatzki's ring, motility disorder, EoE, pill induced esophagitis, r/o eso carcinoma/extrinsic lesions or Achalasia. ? ?Plan: ?-Protonix 40 mg p.o. QD, 1/2 hr before breakfast. #30.  11 refills. ?-Ba swallow with Ba tablet as well. ?-EGD with eso bx and possibly dil.  Discussed risks and benefits including small but definite risks of eso perforation, bleeding, risks of anesthesia.  The benefits were also discussed. Consent forms given. ?-I have instructed patient to chew foods especially meats and breads well and eat slowly. ? ? ? ? ?HPI:   ? ?Joanne Reed is a 51 y.o. female  ?With MS, DM2, anxiety, migraines ? ?C/O Dysphagia x 1 year-mid chest, intermittent but progressive ?Solids and liquids both ?Associated [place] which is getting worse. ?She has tried over-the-counter medications without any significant relief. ?No melena ?No odynophagia ? ?Seen by Dr. Wendie Agreste, advised EGD and further eval. ? ?No nonsteroidals. ? ?She denies having any significant diarrhea or constipation. ? ?Has been trying to lose weight and has been able to lose weight as below ?Wt Readings from Last 3 Encounters:  ?01/25/22 210 lb (95.3 kg)  ?12/15/21 214 lb 6 oz (97.2 kg)  ?10/25/21 226 lb (102.5 kg)  ? ?Past GI work-up: ? ?Colonoscopy 10/25/2021 ?- Three 4 to 6 mm polyps in the proximal sigmoid colon and in the mid descending colon, ?removed with a cold snare. Resected and retrieved. Bx- TAs ?- Non-bleeding internal hemorrhoids. ?- The examination was otherwise normal on direct and retroflexion views. ?- Rpt 3 yrs. ? ?Past Medical History:  ?Diagnosis Date  ? Abnormal finding on MRI of brain 01/01/2020  ? Anxiety   ? Diabetes mellitus without complication ([place])   ? Hypercholesteremia   ? Hyperlipidemia   ? Migraine   ? MS (multiple sclerosis) ([place])   ? Thyromegaly 09/20/2013  ?  TIA (transient ischemic attack) 2021  ? ?  ? Uncontrolled type 2 diabetes mellitus with diabetic polyneuropathy, without long-term current use of insulin 07/10/2017  ? ? ?Past Surgical History:  ?Procedure Laterality Date  ? ABDOMINAL HYSTERECTOMY  2009  ? [facility]--Dr. Quincy Simmonds  ? [place]  ? cholecystectomy  2000  ? COLONOSCOPY  11/14/2007  ? Dr.Lian Tanori  ? ? ?Family History  ?Problem Relation Age of Onset  ? Kidney cancer Mother   ? Skin cancer Mother   ?     unsure of type - not melanoma - place removed from nose.  ? Esophageal cancer Father   ? Hypertension Brother   ? Diabetes Paternal Grandmother   ? Colon cancer Neg Hx   ? Colon polyps Neg Hx   ? Rectal cancer Neg Hx   ? Stomach cancer Neg Hx   ? ? ?Social History  ? ?Tobacco Use  ? Smoking status: Former  ? Smokeless tobacco: Never  ? Tobacco comments:  ?  01/01/20 - quit 22 years ago  ?Vaping Use  ? Vaping Use: Never used  ?Substance Use Topics  ? Alcohol use: Not Currently  ?  Comment: occasional  ? Drug use: Never  ? ? ?Current Outpatient Medications  ?Medication Sig Dispense Refill  ? ALPRAZolam (XANAX) 1 MG tablet Take 1-2 tablets thirty minutes prior to MRI.  May take one additional tablet before entering scanner, if needed.  MUST HAVE DRIVER. 3 tablet 0  ?  aspirin EC 81 MG tablet Take 81 mg by mouth daily.    ? atorvastatin (LIPITOR) 20 MG tablet Take 20 mg by mouth daily.    ? dicyclomine (BENTYL) 10 MG capsule Take 1 capsule (10 mg total) by mouth 2 (two) times daily. 60 capsule 1  ? DULoxetine (CYMBALTA) 60 MG capsule TAKE ONE CAPSULE BY MOUTH EVERY DAY 90 capsule 4  ? glipiZIDE (GLUCOTROL XL) 10 MG 24 hr tablet Take 10 mg by mouth 2 (two) times daily.    ? metFORMIN (GLUCOPHAGE-XR) 500 MG 24 hr tablet Take 500 mg by mouth 5 (five) times daily.    ? traZODone (DESYREL) 50 MG tablet TAKE TWO TABLETS BY MOUTH ('100MG'$ ) AT BEDTIME 60 tablet 1  ? VITAMIN E PO Take 1 tablet by mouth daily. (Patient not taking: Reported on 01/25/2022)     ? ?No current facility-administered medications for this visit.  ? ? ?No Known Allergies ? ?Review of Systems:  ?Constitutional: Denies fever, chills, diaphoresis, appetite change and fatigue.  ?HEENT: Denies photophobia, eye pain, redness, hearing loss, ear pain, congestion, sore throat, rhinorrhea, sneezing, mouth sores, neck pain, neck stiffness and tinnitus.   ?Respiratory: Denies SOB, DOE, cough, chest tightness,  and wheezing.   ?Cardiovascular: Denies chest pain, palpitations and leg swelling.  ?Genitourinary: Denies dysuria, urgency, frequency, hematuria, flank pain and difficulty urinating.  ?Musculoskeletal: Denies myalgias, back pain, joint swelling, arthralgias and gait problem.  ?Skin: No rash.  ?Neurological: Denies dizziness, seizures, syncope, weakness, light-headedness, numbness and headaches.  No neurologic deficits with MS.  She is not taking any medications.  Being followed by neurology ?Hematological: Denies adenopathy. Easy bruising, personal or family bleeding history  ?Psychiatric/Behavioral: Has anxiety or depression ? ?  ? ?Physical Exam:   ? ?BP 116/80   Pulse 90   Ht '5\' 3"'$  (1.6 m)   Wt 210 lb (95.3 kg)   SpO2 97%   BMI 37.20 kg/m?  ?Wt Readings from Last 3 Encounters:  ?01/25/22 210 lb (95.3 kg)  ?12/15/21 214 lb 6 oz (97.2 kg)  ?10/25/21 226 lb (102.5 kg)  ? ?Constitutional:  Well-developed, in no acute distress. ?Psychiatric: Normal mood and affect. Behavior is normal. ?HEENT: Pupils normal.  Conjunctivae are normal. No scleral icterus. ?Cardiovascular: Normal rate, regular rhythm. No edema ?Pulmonary/chest: Effort normal and breath sounds normal. No wheezing, rales or rhonchi. ?Abdominal: Soft, nondistended. Nontender. Bowel sounds active throughout. There are no masses palpable. No hepatomegaly. ?Rectal: Deferred ?Neurological: Alert and oriented to person place and time. ?Skin: Skin is warm and dry. No rashes noted. ? ?Data Reviewed: I have personally reviewed following labs  and imaging studies ? ?CBC: ? ?  Latest Ref Rng & Units 07/10/2021  ?  8:56 PM 07/04/2020  ?  1:00 AM 07/03/2020  ?  7:15 PM  ?CBC  ?WBC 4.0 - 10.5 K/uL 10.4    18.2    ?Hemoglobin 12.0 - 15.0 g/dL 13.8   13.1   13.5    ?Hematocrit 36.0 - 46.0 % 42.4   40.6   41.9    ?Platelets 150 - 400 K/uL 348    378    ? ? ?CMP: ? ?  Latest Ref Rng & Units 07/10/2021  ?  8:56 PM 07/03/2020  ?  7:15 PM 03/30/2020  ?  9:10 AM  ?CMP  ?Glucose 70 - 99 mg/dL 431   245     ?BUN 6 - 20 mg/dL 13   12     ?Creatinine 0.44 -  1.00 mg/dL 0.71   0.68     ?Sodium 135 - 145 mmol/L 135   135     ?Potassium 3.5 - 5.1 mmol/L 4.0   3.2     ?Chloride 98 - 111 mmol/L 99   102     ?CO2 22 - 32 mmol/L 24   23     ?Calcium 8.9 - 10.3 mg/dL 9.0   8.9     ?Total Protein 6.5 - 8.1 g/dL 7.1   6.7   6.7    ?Total Bilirubin 0.3 - 1.2 mg/dL 0.6   0.8     ?Alkaline Phos 38 - 126 U/L 132   96     ?AST 15 - 41 U/L 25   21     ?ALT 0 - 44 U/L 29   20     ? ? ? ? ? ? ?Carmell Austria, MD 01/25/2022, 3:18 PM ? ?Cc: Enid Skeens., MD ? ? ?

## 2022-01-25 NOTE — Patient Instructions (Signed)
If you are age 52 or older, your body mass index should be between 23-30. Your Body mass index is 37.2 kg/m?Marland Kitchen If this is out of the aforementioned range listed, please consider follow up with your Primary Care Provider. ? ?If you are age 29 or younger, your body mass index should be between 19-25. Your Body mass index is 37.2 kg/m?Marland Kitchen If this is out of the aformentioned range listed, please consider follow up with your Primary Care Provider.  ? ?________________________________________________________ ? ?The [facility] providers would like to encourage you to use [facility] to communicate with providers for non-urgent requests or questions.  Due to long hold times on the telephone, sending your provider a message by [facility] may be a faster and more efficient way to get a response.  Please allow 48 business hours for a response.  Please remember that this is for non-urgent requests.  ?_______________________________________________________ ? ?We have sent the following medications to your pharmacy for you to pick up at your convenience: ?Protonix take 30 minutes before breakfast ? ?You have been scheduled for an endoscopy. Please follow written instructions given to you at your visit today. ?If you use inhalers (even only as needed), please bring them with you on the day of your procedure. ? ?You have been scheduled for a Barium Esophogram at [facility] (1st floor of the hospital) on 02-08-2022 at 930am. Please arrive 30 minutes prior to your appointment for registration. Make certain not to have anything to eat or drink 3 hours prior to your test. If you need to reschedule for any reason, please contact radiology at [phone] to do so. ?__________________________________________________________________ ?A barium swallow is an examination that concentrates on views of the esophagus. This tends to be a double contrast exam (barium and two liquids which, when combined, create a gas to distend the wall of the  oesophagus) or single contrast (non-ionic iodine based). The study is usually tailored to your symptoms so a good history is essential. Attention is paid during the study to the form, structure and configuration of the esophagus, looking for functional disorders (such as aspiration, dysphagia, achalasia, motility and reflux) ?EXAMINATION ?You may be asked to change into a gown, depending on the type of swallow being performed. A radiologist and radiographer will perform the procedure. The radiologist will advise you of the ?type of contrast selected for your procedure and direct you during the exam. You will be asked to stand, sit or lie in several different positions and to hold a small amount of fluid in your mouth before being asked to swallow while the imaging is performed .In some instances you may be asked to swallow barium coated marshmallows to assess the motility of a solid food bolus. ?The exam can be recorded as a digital or video fluoroscopy procedure. ?POST PROCEDURE ?It will take 1-2 days for the barium to pass through your system. To facilitate this, it is important, unless otherwise directed, to increase your fluids for the next 24-48hrs and to resume your normal diet.  ?This test typically takes about 30 minutes to perform. ?__________________________________________________________________________________ ? ?Thank you, ? ?Dr. Jackquline Denmark ? ? ? ? ? ?We want to thank you for trusting [facility] with your care. All of our staff and providers value the relationships we have built with our patients, and it is an honor to care for you.  ? ?We are writing to let you know that [facility] will close on Feb 28, 2022, and  we invite you to continue to see Dr. Carmell Austria and Gerrit Heck at the [facility] [place] office location. We are consolidating our serices at these [facility] practices to better provide care. Our office staff will work with  you to ensure a seamless transition.  ? ?Gerrit Heck, DO -Dr. Bryan Lemma will be movig to [facility] at [address] [place], [place] [address], effective Feb 28, 2022.  Contact [phone] to schedule an appointment with him.  ? ?Carmell Austria, MD- Dr. Lyndel Safe will be movig to [facility] at [address], [place], [place] [address], effective Feb 28, 2022.  Contact [phone] to schedule an appointment with him.  ? ?Requesting Medical Records ?If you need to request your medical records, please follow the instructions below. Your medical records are confidential, and a copy can be transferred to another provider or released to you or another person you designate only with your permission. ? ?There are several ways to request your medical records: ?Requests for medical records can be submitted through our practice.   ?You can also request your records electronically, in your MyChart account by selecting the ?Request Health Records? tab.  ?If you need additional information on how to request records, please go to [email or web address]/, choose Patient Information, then select Request Medical Records. ?To make an appointment or if you have any questions about your health care needs, please contact our office at [phone] and one of our staff members will be glad to assist you. ?[facility] is committed to providing exceptional care for you and our community. Thank you for allowing [place] to serve your health care needs. ?Sincerely, ? ?Windy Canny, Director [facility] ?[facility] also offers convenient virtual care options. Sore throat? Sinus problems? Cold or flu symptoms? Get care from the comfort of home with [facility] Video Visits and e-Visits. Learn more about the non-emergency conditions treated and start your virtual visit at [email or web address] ? ?

## 2022-02-08 ENCOUNTER — Ambulatory Visit (HOSPITAL_COMMUNITY)
Admission: RE | Admit: 2022-02-08 | Discharge: 2022-02-08 | Disposition: A | Discharge status: Home / Self Care | Payer: BC Managed Care – PPO | Source: Ambulatory Visit | Attending: Gastroenterology | Admitting: Gastroenterology

## 2022-02-08 DIAGNOSIS — R1319 Other dysphagia: Secondary | ICD-10-CM | POA: Diagnosis present

## 2022-02-08 DIAGNOSIS — K219 Gastro-esophageal reflux disease without esophagitis: Secondary | ICD-10-CM

## 2022-02-09 ENCOUNTER — Ambulatory Visit: Payer: BC Managed Care – PPO | Admitting: Neurology

## 2022-02-09 ENCOUNTER — Encounter: Payer: Self-pay | Admitting: Neurology

## 2022-02-09 VITALS — BP 125/76 | HR 65 | Ht 63.0 in | Wt 212.0 lb

## 2022-02-09 DIAGNOSIS — G35 Multiple sclerosis: Secondary | ICD-10-CM | POA: Diagnosis not present

## 2022-02-09 DIAGNOSIS — G3184 Mild cognitive impairment, so stated: Secondary | ICD-10-CM

## 2022-02-09 DIAGNOSIS — M47812 Spondylosis without myelopathy or radiculopathy, cervical region: Secondary | ICD-10-CM

## 2022-02-09 DIAGNOSIS — R9089 Other abnormal findings on diagnostic imaging of central nervous system: Secondary | ICD-10-CM | POA: Diagnosis not present

## 2022-02-09 MED ORDER — ALPRAZOLAM 1 MG PO TABS: ORAL_TABLET | ORAL | 0 refills | Status: DC

## 2022-02-09 MED ORDER — NORTRIPTYLINE HCL 10 MG PO CAPS: ORAL_CAPSULE | ORAL | 5 refills | Status: DC

## 2022-02-09 NOTE — Patient Instructions (Addendum)
Meds ordered this encounter  ?Medications  ? nortriptyline (PAMELOR) 10 MG capsule  ?  Sig: Take 1 tablet at bedtime x 1 week, then take 2 at bedtime  ?  Dispense:  60 capsule  ?  Refill:  5  ? ALPRAZolam (XANAX) 1 MG tablet  ?  Sig: Take 1-2 tablets thirty minutes prior to MRI.  May take one additional tablet before entering scanner, if needed.  MUST HAVE DRIVER.  ?  Dispense:  3 tablet  ?  Refill:  0  ? ?Stop the trazodone, start taking nortriptlyine working up to 20 mg at bedtime to help with headache and achy pain, sleep ? ?Stay on Cymbalta  ? ?Check MRI imaging to ensure stability  ? ?Check kidney function  ? ?See you back in 6 months  ?
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
## 2022-02-09 NOTE — Progress Notes (Signed)
Chart reviewed, agree above plan ?

## 2022-02-10 ENCOUNTER — Telehealth: Payer: Self-pay | Admitting: Neurology

## 2022-02-10 LAB — COMPREHENSIVE METABOLIC PANEL
ALT: 36 IU/L — ABNORMAL HIGH (ref 0–32)
AST: 34 IU/L (ref 0–40)
Albumin/Globulin Ratio: 1.8 (ref 1.2–2.2)
Albumin: 4 g/dL (ref 3.8–4.9)
Alkaline Phosphatase: 123 IU/L — ABNORMAL HIGH (ref 44–121)
BUN/Creatinine Ratio: 10 (ref 9–23)
BUN: 7 mg/dL (ref 6–24)
Bilirubin Total: 0.4 mg/dL (ref 0.0–1.2)
CO2: 22 mmol/L (ref 20–29)
Calcium: 9.1 mg/dL (ref 8.7–10.2)
Chloride: 100 mmol/L (ref 96–106)
Creatinine, Ser: 0.67 mg/dL (ref 0.57–1.00)
Globulin, Total: 2.2 g/dL (ref 1.5–4.5)
Glucose: 235 mg/dL — ABNORMAL HIGH (ref 70–99)
Potassium: 4.1 mmol/L (ref 3.5–5.2)
Sodium: 140 mmol/L (ref 134–144)
Total Protein: 6.2 g/dL (ref 6.0–8.5)
eGFR: 106 mL/min/{1.73_m2} (ref >59)

## 2022-02-10 NOTE — Telephone Encounter (Signed)
90 mins MRI brain w/wo and MRI cervical w/wo [place] [facility] Bjork: [id number] (exp. 02/10/22-03/08/22) ?Scheduled with patient at [facility] 02/16/22 at 1pm ?

## 2022-02-16 ENCOUNTER — Ambulatory Visit: Payer: BC Managed Care – PPO

## 2022-02-16 DIAGNOSIS — R9089 Other abnormal findings on diagnostic imaging of central nervous system: Secondary | ICD-10-CM

## 2022-02-16 MED ORDER — GADOBENATE DIMEGLUMINE 529 MG/ML IV SOLN
20.0000 mL | Freq: Once | INTRAVENOUS | Status: AC | PRN
  Administered 2022-02-16: 20 mL via INTRAVENOUS

## 2022-02-22 ENCOUNTER — Telehealth: Payer: Self-pay | Admitting: Neurology

## 2022-02-22 DIAGNOSIS — M47812 Spondylosis without myelopathy or radiculopathy, cervical region: Secondary | ICD-10-CM

## 2022-02-22 NOTE — Telephone Encounter (Signed)
I called the patient.  Reviewed MRI films with Dr. Krista Blue.  MRI of the brain appears overall stable in regards to MS. Will hold off on DMT for now.  ? ?MRI cervical spine shows severe bilateral foraminal stenosis at C4-5, C5-6, C6-7.  She reports symptoms of right arm weakness, intermittent numbness.  Has fairly significant neck pain.  Feels unsteady.  Denies any urinary symptoms.  Given MRI findings and clinical symptoms, will refer for neurosurgical evaluation. ? ?IMPRESSION:  ?  ?MRI cervical spine with and without contrast demonstrating: ?- At C4-5. C5-6: disc bulging and uncovertebral joint hypertrophy with moderate spinal stenosis and severe bilateral foraminal stenosis. ?- At C6-7 disc bulging and uncovertebral joint hypertrophy with mild spinal stenosis and severe bilateral foraminal stenosis. ?- No intrinsic or abnormal enhancing spinal cord lesions. ?- Overall stable compared to 12/30/2020. ?  ? ?IMPRESSION:  ?  ?MRI brain with and without contrast demonstrating: ?-Few stable small periventricular subcortical foci of nonspecific T2 hyperintensities. ?-No acute findings. ?

## 2022-02-23 ENCOUNTER — Telehealth: Payer: Self-pay | Admitting: Neurology

## 2022-02-23 NOTE — Telephone Encounter (Signed)
Faxed to [facility] ph # [phone].  ?

## 2022-02-28 NOTE — Telephone Encounter (Signed)
Patient is scheduled to see Dr. Pieter Partridge Dawley on 03/11/22 at 11:30 AM ?

## 2022-03-04 ENCOUNTER — Encounter: Payer: Self-pay | Admitting: Certified Registered Nurse Anesthetist

## 2022-03-09 ENCOUNTER — Ambulatory Visit (AMBULATORY_SURGERY_CENTER): Payer: BC Managed Care – PPO | Admitting: Gastroenterology

## 2022-03-09 ENCOUNTER — Encounter: Payer: Self-pay | Admitting: Gastroenterology

## 2022-03-09 VITALS — BP 118/67 | HR 70 | Temp 98.6°F | Resp 26 | Ht 63.0 in | Wt 210.0 lb

## 2022-03-09 DIAGNOSIS — R1310 Dysphagia, unspecified: Secondary | ICD-10-CM | POA: Diagnosis not present

## 2022-03-09 DIAGNOSIS — K2970 Gastritis, unspecified, without bleeding: Secondary | ICD-10-CM

## 2022-03-09 DIAGNOSIS — K219 Gastro-esophageal reflux disease without esophagitis: Secondary | ICD-10-CM | POA: Diagnosis present

## 2022-03-09 DIAGNOSIS — K222 Esophageal obstruction: Secondary | ICD-10-CM

## 2022-03-09 DIAGNOSIS — K449 Diaphragmatic hernia without obstruction or gangrene: Secondary | ICD-10-CM

## 2022-03-09 DIAGNOSIS — K319 Disease of stomach and duodenum, unspecified: Secondary | ICD-10-CM | POA: Diagnosis not present

## 2022-03-09 DIAGNOSIS — K2950 Unspecified chronic gastritis without bleeding: Secondary | ICD-10-CM | POA: Diagnosis not present

## 2022-03-09 MED ORDER — SODIUM CHLORIDE 0.9 % IV SOLN: 500.0000 mL | Freq: Once | INTRAVENOUS | Status: DC

## 2022-03-09 NOTE — Progress Notes (Signed)
Called to room to assist during endoscopic procedure.  Patient ID and intended procedure confirmed with present staff. Received instructions for my participation in the procedure from the performing physician.  

## 2022-03-09 NOTE — Progress Notes (Signed)
Sedate, gd SR, tolerated procedure well, VSS, report to RN 

## 2022-03-09 NOTE — Patient Instructions (Signed)
Handout on gastritis. Follow post-dilation diet - handout given. Await pathology results. Continue present medications including your protonix once a day   YOU HAD AN ENDOSCOPIC PROCEDURE TODAY AT [facility]:   Refer to the procedure report that was given to you for any specific questions about what was found during the examination.  If the procedure report does not answer your questions, please call your gastroenterologist to clarify.  If you requested that your care partner not be given the details of your procedure findings, then the procedure report has been included in a sealed envelope for you to review at your convenience later.  YOU SHOULD EXPECT: Some feelings of bloating in the abdomen. Passage of more gas than usual.  Walking can help get rid of the air that was put into your GI tract during the procedure and reduce the bloating. If you had a lower endoscopy (such as a colonoscopy or flexible sigmoidoscopy) you may notice spotting of blood in your stool or on the toilet paper. If you underwent a bowel prep for your procedure, you may not have a normal bowel movement for a few days.  Please Note:  You might notice some irritation and congestion in your nose or some drainage.  This is from the oxygen used during your procedure.  There is no need for concern and it should clear up in a day or so.  SYMPTOMS TO REPORT IMMEDIATELY:  Following upper endoscopy (EGD)  Vomiting of blood or coffee ground material  New chest pain or pain under the shoulder blades  Painful or persistently difficult swallowing  New shortness of breath  Fever of 100F or higher  Black, tarry-looking stools  For urgent or emergent issues, a gastroenterologist can be reached at any hour by calling [phone]. Do not use MyChart messaging for urgent concerns.    DIET:  We do recommend a small meal at first, but then you may proceed to your regular diet.  Drink plenty of fluids but you should  avoid alcoholic beverages for 24 hours.  ACTIVITY:  You should plan to take it easy for the rest of today and you should NOT DRIVE or use heavy machinery until tomorrow (because of the sedation medicines used during the test).    FOLLOW UP: Our staff will call the number listed on your records 48-72 hours following your procedure to check on you and address any questions or concerns that you may have regarding the information given to you following your procedure. If we do not reach you, we will leave a message.  We will attempt to reach you two times.  During this call, we will ask if you have developed any symptoms of COVID 19. If you develop any symptoms (ie: fever, flu-like symptoms, shortness of breath, cough etc.) before then, please call [phone].  If you test positive for Covid 19 in the 2 weeks post procedure, please call and report this information to [place].    If any biopsies were taken you will be contacted by phone or by letter within the next 1-3 weeks.  Please call us at [phone] if you have not heard about the biopsies in 3 weeks.    SIGNATURES/CONFIDENTIALITY: You and/or your care partner have signed paperwork which will be entered into your electronic medical record.  These signatures attest to the fact that that the information above on your After Visit Summary has been reviewed and is understood.  Full responsibility of the confidentiality of this  discharge information lies with you and/or your care-partner.  

## 2022-03-09 NOTE — Op Note (Signed)
[facility] Patient Name: Joanne Reed Procedure Date: 03/09/2022 10:11 AM MRN: [id number] Endoscopist: Jackquline Denmark , MD Age: 51 Referring MD:  Date of Birth: 11-06-1970 Gender: Female Account #: [id number] Procedure:                Upper GI endoscopy Indications:              Dysphagia. GERD. Medicines:                Monitored Anesthesia Care Procedure:                Pre-Anesthesia Assessment:                           - Prior to the procedure, a History and Physical                            was performed, and patient medications and                            allergies were reviewed. The patient's tolerance of                            previous anesthesia was also reviewed. The risks                            and benefits of the procedure and the sedation                            options and risks were discussed with the patient.                            All questions were answered, and informed consent                            was obtained. Prior Anticoagulants: The patient has                            taken no previous anticoagulant or antiplatelet                            agents. ASA Grade Assessment: II - A patient with                            mild systemic disease. After reviewing the risks                            and benefits, the patient was deemed in                            satisfactory condition to undergo the procedure.                           After obtaining informed consent, the endoscope was  passed under direct vision. Throughout the                            procedure, the patient's blood pressure, pulse, and                            oxygen saturations were monitored continuously. The                            GIF [id number] #[id number] was introduced through the                            mouth, and advanced to the second part of duodenum.                            The upper GI endoscopy was accomplished  without                            difficulty. The patient tolerated the procedure                            well. Scope In: Scope Out: Findings:                 The examined esophagus was mildly tortuous.                            Biopsies were obtained from the proximal and distal                            esophagus with cold forceps for histology of                            suspected eosinophilic esophagitis.                           A non-obstructing and mild Schatzki ring was found                            at the gastroesophageal junction, 36 cm from the                            incisors. Biopsies were taken with a cold forceps                            for histology. The scope was withdrawn. Dilation                            was performed with a Maloney dilator with mild                            resistance at 58 Fr and 54 Fr.                           A small  hiatal hernia was present. Best visualized                            on Valsalva.                           Localized mild inflammation characterized by                            erythema was found in the gastric antrum. Biopsies                            were taken with a cold forceps for histology.                           The examined duodenum was normal. Biopsies for                            histology were taken with a cold forceps for                            evaluation of celiac disease. Complications:            No immediate complications. Estimated Blood Loss:     Estimated blood loss: none. Impression:               - Non-obstructing and mild Schatzki ring. Biopsied.                            Dilated.                           - Small hiatal hernia.                           - Gastritis. Biopsied.                           - Normal examined duodenum. Biopsied. Recommendation:           - Patient has a contact number available for                            emergencies. The signs and symptoms  of potential                            delayed complications were discussed with the                            patient. Return to normal activities tomorrow.                            Written discharge instructions were provided to the                            patient.                           -  Post dilatation diet.                           - Continue present medications including Protonix                            40 mg p.o. once a day.                           - Await pathology results.                           - The findings and recommendations were discussed                            with the patient. Jackquline Denmark, MD 03/09/2022 10:35:55 AM This report has been signed electronically.

## 2022-03-09 NOTE — Progress Notes (Signed)
Chief Complaint:   Referring Provider:  Enid Skeens., MD      ASSESSMENT AND PLAN;   #1. GERD with eso dysphagia. D/d includes eso stricture, Schatzki's ring, motility disorder, EoE, pill induced esophagitis, r/o eso carcinoma/extrinsic lesions or Achalasia.  Plan: -Protonix 40 mg p.o. QD, 1/2 hr before breakfast. #30.  11 refills. -Ba swallow with Ba tablet as well. -EGD with eso bx and possibly dil.  Discussed risks and benefits including small but definite risks of eso perforation, bleeding, risks of anesthesia.  The benefits were also discussed. Consent forms given. -I have instructed patient to chew foods especially meats and breads well and eat slowly.     HPI:    Joanne Reed is a 51 y.o. female  With MS, DM2, anxiety, migraines  C/O Dysphagia x 1 year-mid chest, intermittent but progressive Solids and liquids both Associated [place] which is getting worse. She has tried over-the-counter medications without any significant relief. No melena No odynophagia  Seen by Dr. Wendie Agreste, advised EGD and further eval.  No nonsteroidals.  She denies having any significant diarrhea or constipation.  Has been trying to lose weight and has been able to lose weight as below Wt Readings from Last 3 Encounters:  03/09/22 210 lb (95.3 kg)  02/09/22 212 lb (96.2 kg)  01/25/22 210 lb (95.3 kg)   Past GI work-up:  Colonoscopy 10/25/2021 - Three 4 to 6 mm polyps in the proximal sigmoid colon and in the mid descending colon, removed with a cold snare. Resected and retrieved. Bx- TAs - Non-bleeding internal hemorrhoids. - The examination was otherwise normal on direct and retroflexion views. - Rpt 3 yrs.  Past Medical History:  Diagnosis Date   Abnormal finding on MRI of brain 01/01/2020   Anxiety    Diabetes mellitus without complication ([place])    Hypercholesteremia    Hyperlipidemia    Migraine    MS (multiple sclerosis) ([place])    Thyromegaly 09/20/2013   TIA  (transient ischemic attack) 2021   ?   Uncontrolled type 2 diabetes mellitus with diabetic polyneuropathy, without long-term current use of insulin 07/10/2017    Past Surgical History:  Procedure Laterality Date   ABDOMINAL HYSTERECTOMY  2009   [facility]--Dr. Augustin Coupe SECTION  1987, 1990, 1999   cholecystectomy  2000   COLONOSCOPY  11/14/2007   Dr.Rufus Beske    Family History  Problem Relation Age of Onset   Kidney cancer Mother    Skin cancer Mother        unsure of type - not melanoma - place removed from nose.   Esophageal cancer Father    Hypertension Brother    Diabetes Paternal Grandmother    Colon cancer Neg Hx    Colon polyps Neg Hx    Rectal cancer Neg Hx    Stomach cancer Neg Hx     Social History   Tobacco Use   Smoking status: Former   Smokeless tobacco: Never   Tobacco comments:    01/01/20 - quit 22 years ago  Vaping Use   Vaping Use: Never used  Substance Use Topics   Alcohol use: Not Currently    Comment: occasional   Drug use: Never    Current Outpatient Medications  Medication Sig Dispense Refill   ALPRAZolam (XANAX) 1 MG tablet Take 1-2 tablets thirty minutes prior to MRI.  May take one additional tablet before entering scanner, if needed.  MUST HAVE DRIVER. 3 tablet 0  aspirin EC 81 MG tablet Take 81 mg by mouth daily.     atorvastatin (LIPITOR) 20 MG tablet Take 20 mg by mouth daily.     dicyclomine (BENTYL) 10 MG capsule Take 1 capsule (10 mg total) by mouth 2 (two) times daily. 60 capsule 1   DULoxetine (CYMBALTA) 60 MG capsule TAKE ONE CAPSULE BY MOUTH EVERY DAY 90 capsule 4   glipiZIDE (GLUCOTROL XL) 10 MG 24 hr tablet Take 10 mg by mouth 2 (two) times daily.     metFORMIN (GLUCOPHAGE-XR) 500 MG 24 hr tablet Take 500 mg by mouth 5 (five) times daily.     nortriptyline (PAMELOR) 10 MG capsule Take 1 tablet at bedtime x 1 week, then take 2 at bedtime 60 capsule 5   pantoprazole (PROTONIX) 40 MG tablet Take 1 tablet (40 mg total) by mouth  daily. 90 tablet 3   VITAMIN E PO Take 1 tablet by mouth daily.     No current facility-administered medications for this visit.    No Known Allergies  Review of Systems:  Constitutional: Denies fever, chills, diaphoresis, appetite change and fatigue.  HEENT: Denies photophobia, eye pain, redness, hearing loss, ear pain, congestion, sore throat, rhinorrhea, sneezing, mouth sores, neck pain, neck stiffness and tinnitus.   Respiratory: Denies SOB, DOE, cough, chest tightness,  and wheezing.   Cardiovascular: Denies chest pain, palpitations and leg swelling.  Genitourinary: Denies dysuria, urgency, frequency, hematuria, flank pain and difficulty urinating.  Musculoskeletal: Denies myalgias, back pain, joint swelling, arthralgias and gait problem.  Skin: No rash.  Neurological: Denies dizziness, seizures, syncope, weakness, light-headedness, numbness and headaches.  No neurologic deficits with MS.  She is not taking any medications.  Being followed by neurology Hematological: Denies adenopathy. Easy bruising, personal or family bleeding history  Psychiatric/Behavioral: Has anxiety or depression     Physical Exam:    BP 135/72   Pulse 75   Temp 98.6 F (37 C) (Skin)   Ht '5\' 3"'$  (1.6 m)   Wt 210 lb (95.3 kg)   SpO2 97%   BMI 37.20 kg/m  Wt Readings from Last 3 Encounters:  03/09/22 210 lb (95.3 kg)  02/09/22 212 lb (96.2 kg)  01/25/22 210 lb (95.3 kg)   Constitutional:  Well-developed, in no acute distress. Psychiatric: Normal mood and affect. Behavior is normal. HEENT: Pupils normal.  Conjunctivae are normal. No scleral icterus. Cardiovascular: Normal rate, regular rhythm. No edema Pulmonary/chest: Effort normal and breath sounds normal. No wheezing, rales or rhonchi. Abdominal: Soft, nondistended. Nontender. Bowel sounds active throughout. There are no masses palpable. No hepatomegaly. Rectal: Deferred Neurological: Alert and oriented to person place and time. Skin: Skin is  warm and dry. No rashes noted.  Data Reviewed: I have personally reviewed following labs and imaging studies  CBC:    Latest Ref Rng & Units 07/10/2021    8:56 PM 07/04/2020    1:00 AM 07/03/2020    7:15 PM  CBC  WBC 4.0 - 10.5 K/uL 10.4    18.2    Hemoglobin 12.0 - 15.0 g/dL 13.8   13.1   13.5    Hematocrit 36.0 - 46.0 % 42.4   40.6   41.9    Platelets 150 - 400 K/uL 348    378      CMP:    Latest Ref Rng & Units 02/09/2022    9:22 AM 07/10/2021    8:56 PM 07/03/2020    7:15 PM  CMP  Glucose 70 - 99  mg/dL 235   431   245    BUN 6 - 24 mg/dL '7   13   12    '$ Creatinine 0.57 - 1.00 mg/dL 0.67   0.71   0.68    Sodium 134 - 144 mmol/L 140   135   135    Potassium 3.5 - 5.2 mmol/L 4.1   4.0   3.2    Chloride 96 - 106 mmol/L 100   99   102    CO2 20 - 29 mmol/L '22   24   23    '$ Calcium 8.7 - 10.2 mg/dL 9.1   9.0   8.9    Total Protein 6.0 - 8.5 g/dL 6.2   7.1   6.7    Total Bilirubin 0.0 - 1.2 mg/dL 0.4   0.6   0.8    Alkaline Phos 44 - 121 IU/L 123   132   96    AST 0 - 40 IU/L 34   25   21    ALT 0 - 32 IU/L 36   29   [place], MD 03/09/2022, 10:11 AM  Cc: Enid Skeens., MD

## 2022-03-10 ENCOUNTER — Telehealth: Payer: Self-pay

## 2022-03-10 NOTE — Telephone Encounter (Signed)
  Follow up Call-     03/09/2022   10:04 AM 10/25/2021    8:56 AM  Call back number  Post procedure Call Back phone  # [phone]  Permission to leave phone message Yes Yes     Patient questions:  Do you have a fever, pain , or abdominal swelling? No. Pain Score  2 *  Have you tolerated food without any problems? No.  Have you been able to return to your normal activities? Yes.    Do you have any questions about your discharge instructions: Diet   No. Medications  No. Follow up visit  No.  Do you have questions or concerns about your Care? No.  Actions: * If pain score is 4 or above: No action needed, pain <4.  Pt having a small amount of pain with swallowing. Said that it is slightly improved since yesterday. Advised to continue with soft diet and advance diet as tolerated. Also advised to call with any worsening symptoms.

## 2022-03-11 ENCOUNTER — Encounter: Payer: Self-pay | Admitting: Gastroenterology

## 2022-03-16 ENCOUNTER — Other Ambulatory Visit: Payer: Self-pay | Admitting: Nurse Practitioner

## 2022-05-18 ENCOUNTER — Other Ambulatory Visit: Payer: Self-pay | Admitting: Nurse Practitioner

## 2022-07-18 ENCOUNTER — Other Ambulatory Visit: Payer: Self-pay | Admitting: Neurology

## 2022-07-18 ENCOUNTER — Other Ambulatory Visit: Payer: Self-pay | Admitting: Nurse Practitioner

## 2022-07-21 ENCOUNTER — Telehealth: Payer: Self-pay | Admitting: Neurology

## 2022-07-21 NOTE — Telephone Encounter (Signed)
LVM and sent mychart msg informing pt of r/s needed for 10/26 appt- NP out.

## 2022-08-11 ENCOUNTER — Ambulatory Visit: Payer: BC Managed Care – PPO | Admitting: Neurology

## 2022-08-17 ENCOUNTER — Other Ambulatory Visit: Payer: Self-pay | Admitting: Neurology

## 2022-08-18 ENCOUNTER — Telehealth: Payer: Self-pay | Admitting: Neurology

## 2022-08-18 MED ORDER — NORTRIPTYLINE HCL 10 MG PO CAPS: ORAL_CAPSULE | ORAL | 5 refills | Status: DC

## 2022-08-18 NOTE — Addendum Note (Signed)
Addended by: Verlin Grills on: 08/18/2022 03:33 PM   Modules accepted: Orders

## 2022-08-18 NOTE — Telephone Encounter (Signed)
I reviewed pt's chart and refill is appropriate.  I have sent to pharmacy and updated pt via my chart.

## 2022-08-18 NOTE — Telephone Encounter (Signed)
Pt is f/u on the refill request for her nortriptyline (PAMELOR) 10 MG capsule.  Pt states she is going out of town on tomorrow morning and wants to make sure the nortriptyline (PAMELOR) 10 MG capsule is called into [facility]

## 2022-09-17 ENCOUNTER — Other Ambulatory Visit: Payer: Self-pay | Admitting: Nurse Practitioner

## 2022-11-24 ENCOUNTER — Other Ambulatory Visit: Payer: Self-pay | Admitting: Gastroenterology

## 2022-11-26 ENCOUNTER — Other Ambulatory Visit: Payer: Self-pay | Admitting: Neurology

## 2023-02-20 ENCOUNTER — Other Ambulatory Visit: Payer: Self-pay

## 2023-02-20 MED ORDER — NORTRIPTYLINE HCL 10 MG PO CAPS: ORAL_CAPSULE | ORAL | 0 refills | Status: DC

## 2023-05-15 ENCOUNTER — Other Ambulatory Visit: Payer: Self-pay | Admitting: Neurological Surgery

## 2023-05-18 ENCOUNTER — Other Ambulatory Visit: Payer: Self-pay | Admitting: Gastroenterology

## 2023-05-23 ENCOUNTER — Encounter (HOSPITAL_COMMUNITY): Payer: Self-pay

## 2023-05-23 NOTE — Pre-Procedure Instructions (Signed)
Surgical Instructions   Your procedure is scheduled on June 01, 2023. Report to [facility] Main Entrance "A" at 5:30 A.M., then check in with the Admitting office. Any questions or running late day of surgery: call [phone]  Questions prior to your surgery date: call [phone], Monday-Friday, 8am-4pm. If you experience any cold or flu symptoms such as cough, fever, chills, shortness of breath, etc. between now and your scheduled surgery, please notify us at the above number.     Remember:  Do not eat or drink after midnight the night before your surgery    Take these medicines the morning of surgery with A SIP OF WATER: DULoxetine (CYMBALTA)  pantoprazole (PROTONIX)    Follow your surgeon's instructions on when to stop Aspirin.  If no instructions were given by your surgeon then you will need to call the office to get those instructions.     One week prior to surgery, STOP taking any Aleve, Naproxen, Ibuprofen, Motrin, Advil, Goody's, BC's, all herbal medications, fish oil, and non-prescription vitamins.           WHAT DO I DO ABOUT MY DIABETES MEDICATION?   Do not take metFORMIN (GLUCOPHAGE) the morning of surgery.  DO NOT take your glipiZIDE (GLUCOTROL XL) the evening before surgery or the morning of surgery.      STOP taking your [facility] one week prior to surgery. Your last dose is August August 7th.   HOW TO MANAGE YOUR DIABETES BEFORE AND AFTER SURGERY  Why is it important to control my blood sugar before and after surgery? Improving blood sugar levels before and after surgery helps healing and can limit problems. A way of improving blood sugar control is eating a healthy diet by:  Eating less sugar and carbohydrates  Increasing activity/exercise  Talking with your doctor about reaching your blood sugar goals High blood sugars (greater than 180 mg/dL) can raise your risk of infections and slow your recovery, so you will need to focus on controlling your  diabetes during the weeks before surgery. Make sure that the doctor who takes care of your diabetes knows about your planned surgery including the date and location.  How do I manage my blood sugar before surgery? Check your blood sugar at least 4 times a day, starting 2 days before surgery, to make sure that the level is not too high or low.  Check your blood sugar the morning of your surgery when you wake up and every 2 hours until you get to the Short Stay unit.  If your blood sugar is less than 70 mg/dL, you will need to treat for low blood sugar: Do not take insulin. Treat a low blood sugar (less than 70 mg/dL) with  cup of clear juice (cranberry or apple), 4 glucose tablets, OR glucose gel. Recheck blood sugar in 15 minutes after treatment (to make sure it is greater than 70 mg/dL). If your blood sugar is not greater than 70 mg/dL on recheck, call [phone] for further instructions. Report your blood sugar to the short stay nurse when you get to Short Stay.  If you are admitted to the hospital after surgery: Your blood sugar will be checked by the staff and you will probably be given insulin after surgery (instead of oral diabetes medicines) to make sure you have good blood sugar levels. The goal for blood sugar control after surgery is 80-180 mg/dL.             Do NOT Smoke (Tobacco/Vaping) for  24 hours prior to your procedure.  If you use a CPAP at night, you may bring your mask/headgear for your overnight stay.   You will be asked to remove any contacts, glasses, piercing's, hearing aid's, dentures/partials prior to surgery. Please bring cases for these items if needed.    Patients discharged the day of surgery will not be allowed to drive home, and someone needs to stay with them for 24 hours.  SURGICAL WAITING ROOM VISITATION Patients may have no more than 2 support people in the waiting area - these visitors may rotate.   Pre-op nurse will coordinate an appropriate time  for 1 ADULT support person, who may not rotate, to accompany patient in pre-op.  Children under the age of 80 must have an adult with them who is not the patient and must remain in the main waiting area with an adult.  If the patient needs to stay at the hospital during part of their recovery, the visitor guidelines for inpatient rooms apply.  Please refer to the [facility] website for the visitor guidelines for any additional information.   If you received a COVID test during your pre-op visit  it is requested that you wear a mask when out in public, stay away from anyone that may not be feeling well and notify your surgeon if you develop symptoms. If you have been in contact with anyone that has tested positive in the last 10 days please notify you surgeon.      Pre-operative 5 CHG Bathing Instructions   You can play a key role in reducing the risk of infection after surgery. Your skin needs to be as free of germs as possible. You can reduce the number of germs on your skin by washing with CHG (chlorhexidine gluconate) soap before surgery. CHG is an antiseptic soap that kills germs and continues to kill germs even after washing.   DO NOT use if you have an allergy to chlorhexidine/CHG or antibacterial soaps. If your skin becomes reddened or irritated, stop using the CHG and notify one of our RNs at [phone].   Please shower with the CHG soap starting 4 days before surgery using the following schedule:     Please keep in mind the following:  DO NOT shave, including legs and underarms, starting the day of your first shower.   You may shave your face at any point before/day of surgery.  Place clean sheets on your bed the day you start using CHG soap. Use a clean washcloth (not used since being washed) for each shower. DO NOT sleep with pets once you start using the CHG.   CHG Shower Instructions:  If you choose to wash your hair and private area, wash first with your normal  shampoo/soap.  After you use shampoo/soap, rinse your hair and body thoroughly to remove shampoo/soap residue.  Turn the water OFF and apply about 3 tablespoons (45 ml) of CHG soap to a CLEAN washcloth.  Apply CHG soap ONLY FROM YOUR NECK DOWN TO YOUR TOES (washing for 3-5 minutes)  DO NOT use CHG soap on face, private areas, open wounds, or sores.  Pay special attention to the area where your surgery is being performed.  If you are having back surgery, having someone wash your back for you may be helpful. Wait 2 minutes after CHG soap is applied, then you may rinse off the CHG soap.  Pat dry with a clean towel  Put on clean clothes/pajamas   If you  choose to wear lotion, please use ONLY the CHG-compatible lotions on the back of this paper.   Additional instructions for the day of surgery: DO NOT APPLY any lotions, deodorants, cologne, or perfumes.   Do not bring valuables to the hospital. [facility] is not responsible for any belongings/valuables. Do not wear nail polish, gel polish, artificial nails, or any other type of covering on natural nails (fingers and toes) Do not wear jewelry or makeup Put on clean/comfortable clothes.  Please brush your teeth.  Ask your nurse before applying any prescription medications to the skin.     CHG Compatible Lotions   Aveeno Moisturizing lotion  Cetaphil Moisturizing Cream  Cetaphil Moisturizing Lotion  Clairol Herbal Essence Moisturizing Lotion, Dry Skin  Clairol Herbal Essence Moisturizing Lotion, Extra Dry Skin  Clairol Herbal Essence Moisturizing Lotion, Normal Skin  Curel Age Defying Therapeutic Moisturizing Lotion with Alpha Hydroxy  Curel Extreme Care Body Lotion  Curel Soothing Hands Moisturizing Hand Lotion  Curel Therapeutic Moisturizing Cream, Fragrance-Free  Curel Therapeutic Moisturizing Lotion, Fragrance-Free  Curel Therapeutic Moisturizing Lotion, Original Formula  Eucerin Daily Replenishing Lotion  Eucerin Dry Skin Therapy  Plus Alpha Hydroxy Crme  Eucerin Dry Skin Therapy Plus Alpha Hydroxy Lotion  Eucerin Original Crme  Eucerin Original Lotion  Eucerin Plus Crme Eucerin Plus Lotion  Eucerin TriLipid Replenishing Lotion  Keri Anti-Bacterial Hand Lotion  Keri Deep Conditioning Original Lotion Dry Skin Formula Softly Scented  Keri Deep Conditioning Original Lotion, Fragrance Free Sensitive Skin Formula  Keri Lotion Fast Absorbing Fragrance Free Sensitive Skin Formula  Keri Lotion Fast Absorbing Softly Scented Dry Skin Formula  Keri Original Lotion  Keri Skin Renewal Lotion Keri Silky Smooth Lotion  Keri Silky Smooth Sensitive Skin Lotion  Nivea Body Creamy Conditioning Oil  Nivea Body Extra Enriched Lotion  Nivea Body Original Lotion  Nivea Body Sheer Moisturizing Lotion Nivea Crme  Nivea Skin Firming Lotion  NutraDerm 30 Skin Lotion  NutraDerm Skin Lotion  NutraDerm Therapeutic Skin Cream  NutraDerm Therapeutic Skin Lotion  ProShield Protective Hand Cream  Provon moisturizing lotion  Please read over the following fact sheets that you were given.

## 2023-05-23 NOTE — Progress Notes (Signed)
PCP -  Cardiologist -   Chest x-ray - n/a EKG - 05/24/23 Stress Test - n/a ECHO - 12/2019 [facility] Cardiac Cath - n/a  ICD Pacemaker/Loop - n/a  Sleep Study -  n/a CPAP - none  Diabetes Type  Do not take Metformin on the morning of surgery.  Do not take Glipizide the evening prior or the morning of surgery.    Stop taking your Mounjaro one week prior to surgery.  Last dose is 05/24/23.  If your blood sugar is less than 70 mg/dL, you will need to treat for low blood sugar: Treat a low blood sugar (less than 70 mg/dL) with  cup of clear juice (cranberry or apple), 4 glucose tablets, OR glucose gel. Recheck blood sugar in 15 minutes after treatment (to make sure it is greater than 70 mg/dL). If your blood sugar is not greater than 70 mg/dL on recheck, call [phone] for further instructions.  Aspirin Instructions: Follow your surgeon's instructions on when to stop aspirin prior to surgery,  If no instructions were given by your surgeon then you will need to call the office for those instructions.  NPO  Anesthesia review: ***  STOP now taking any Aspirin (unless otherwise instructed by your surgeon), Aleve, Naproxen, Ibuprofen, Motrin, Advil, Goody's, BC's, all herbal medications, fish oil, and all vitamins.   Coronavirus Screening Do you have any of the following symptoms:  Cough yes/no: No Fever (>100.55F)  yes/no: No Runny nose yes/no: No Sore throat yes/no: No Difficulty breathing/shortness of breath  yes/no: No  Have you traveled in the last 14 days and where? yes/no: No  Patient verbalized understanding of instructions that were given to them at the PAT appointment. Patient was also instructed that they will need to review over the PAT instructions again at home before surgery.

## 2023-05-24 ENCOUNTER — Encounter (HOSPITAL_COMMUNITY)
Admission: RE | Admit: 2023-05-24 | Discharge: 2023-05-24 | Disposition: A | Discharge status: Home / Self Care | Payer: BC Managed Care – PPO | Source: Ambulatory Visit | Attending: Neurological Surgery | Admitting: Neurological Surgery

## 2023-05-24 ENCOUNTER — Other Ambulatory Visit: Payer: Self-pay

## 2023-05-24 ENCOUNTER — Encounter (HOSPITAL_COMMUNITY): Payer: Self-pay

## 2023-05-24 VITALS — BP 112/62 | HR 78 | Temp 98.2°F | Resp 18 | Ht 62.0 in | Wt 209.5 lb

## 2023-05-24 DIAGNOSIS — Z01818 Encounter for other preprocedural examination: Secondary | ICD-10-CM | POA: Diagnosis present

## 2023-05-24 DIAGNOSIS — Z01810 Encounter for preprocedural cardiovascular examination: Secondary | ICD-10-CM | POA: Diagnosis not present

## 2023-05-24 DIAGNOSIS — Z01812 Encounter for preprocedural laboratory examination: Secondary | ICD-10-CM | POA: Insufficient documentation

## 2023-05-24 DIAGNOSIS — E119 Type 2 diabetes mellitus without complications: Secondary | ICD-10-CM | POA: Diagnosis not present

## 2023-05-24 HISTORY — DX: Unspecified osteoarthritis, unspecified site: M19.90

## 2023-05-24 HISTORY — DX: Gastro-esophageal reflux disease without esophagitis: K21.9

## 2023-05-24 HISTORY — DX: Depression, unspecified: F32.A

## 2023-05-24 HISTORY — DX: Personal history of urinary calculi: Z87.442

## 2023-05-24 LAB — COMPREHENSIVE METABOLIC PANEL
ALT: 17 U/L (ref 0–44)
AST: 17 U/L (ref 15–41)
Albumin: 3.7 g/dL (ref 3.5–5.0)
Alkaline Phosphatase: 55 U/L (ref 38–126)
Anion gap: 11 (ref 5–15)
BUN: 9 mg/dL (ref 6–20)
CO2: 24 mmol/L (ref 22–32)
Calcium: 8.7 mg/dL — ABNORMAL LOW (ref 8.9–10.3)
Chloride: 99 mmol/L (ref 98–111)
Creatinine, Ser: 0.77 mg/dL (ref 0.44–1.00)
GFR, Estimated: >60 mL/min (ref >60)
Glucose, Bld: 169 mg/dL — ABNORMAL HIGH (ref 70–99)
Potassium: 3.8 mmol/L (ref 3.5–5.1)
Sodium: 134 mmol/L — ABNORMAL LOW (ref 135–145)
Total Bilirubin: 0.7 mg/dL (ref 0.3–1.2)
Total Protein: 6.5 g/dL (ref 6.5–8.1)

## 2023-05-24 LAB — HEMOGLOBIN A1C
Hgb A1c MFr Bld: 6.3 % — ABNORMAL HIGH (ref 4.8–5.6)
Mean Plasma Glucose: 134.11 mg/dL

## 2023-05-24 LAB — TYPE AND SCREEN
ABO/RH(D): O POS
Antibody Screen: NEGATIVE

## 2023-05-24 LAB — CBC
HCT: 40.2 % (ref 36.0–46.0)
Hemoglobin: 12.8 g/dL (ref 12.0–15.0)
MCH: 28.7 pg (ref 26.0–34.0)
MCHC: 31.8 g/dL (ref 30.0–36.0)
MCV: 90.1 fL (ref 80.0–100.0)
Platelets: 352 10*3/uL (ref 150–400)
RBC: 4.46 MIL/uL (ref 3.87–5.11)
RDW: 14.9 % (ref 11.5–15.5)
WBC: 8.6 10*3/uL (ref 4.0–10.5)
nRBC: 0 % (ref 0.0–0.2)

## 2023-05-24 LAB — SURGICAL PCR SCREEN
MRSA, PCR: NEGATIVE
Staphylococcus aureus: NEGATIVE

## 2023-05-24 LAB — GLUCOSE, CAPILLARY: Glucose-Capillary: 172 mg/dL — ABNORMAL HIGH (ref 70–99)

## 2023-05-31 NOTE — Anesthesia Preprocedure Evaluation (Signed)
Anesthesia Evaluation  Patient identified by MRN, date of birth, ID band Patient awake    Reviewed: Allergy & Precautions, NPO status , Patient's Chart, lab work & pertinent test results  History of Anesthesia Complications Negative for: history of anesthetic complications  Airway Mallampati: II  TM Distance: >3 FB Neck ROM: Full    Dental  (+) Chipped, Dental Advisory Given   Pulmonary former smoker   breath sounds clear to auscultation       Cardiovascular hypertension, Pt. on medications (-) angina  Rhythm:Regular Rate:Normal     Neuro/Psych  Headaches  Anxiety Depression    MS TIA   GI/Hepatic Neg liver ROS,GERD  Medicated and Controlled,,  Endo/Other  diabetes (glu 87), Oral Hypoglycemic Agents  Mounjaro: last 7/31 BMI 38  Renal/GU negative Renal ROS     Musculoskeletal  (+) Arthritis ,    Abdominal   Peds  Hematology negative hematology ROS (+)   Anesthesia Other Findings   Reproductive/Obstetrics                             Anesthesia Physical Anesthesia Plan  ASA: 3  Anesthesia Plan: General   Post-op Pain Management: Tylenol PO (pre-op)*   Induction: Intravenous  PONV Risk Score and Plan: 3 and Ondansetron, Dexamethasone and Scopolamine patch - Pre-op  Airway Management Planned: Oral ETT and Video Laryngoscope Planned  Additional Equipment: None  Intra-op Plan:   Post-operative Plan: Extubation in OR  Informed Consent: I have reviewed the patients History and Physical, chart, labs and discussed the procedure including the risks, benefits and alternatives for the proposed anesthesia with the patient or authorized representative who has indicated his/her understanding and acceptance.     Dental advisory given  Plan Discussed with: CRNA and Surgeon  Anesthesia Plan Comments:         Anesthesia Quick Evaluation

## 2023-06-01 ENCOUNTER — Inpatient Hospital Stay (HOSPITAL_COMMUNITY): Payer: Self-pay | Admitting: Anesthesiology

## 2023-06-01 ENCOUNTER — Inpatient Hospital Stay (HOSPITAL_COMMUNITY)
Admission: RE | Admit: 2023-06-01 | Discharge: 2023-06-02 | DRG: 473 | Disposition: A | Discharge status: Home / Self Care | Payer: BC Managed Care – PPO | Attending: Neurological Surgery | Admitting: Neurological Surgery

## 2023-06-01 ENCOUNTER — Other Ambulatory Visit: Payer: Self-pay

## 2023-06-01 ENCOUNTER — Inpatient Hospital Stay (HOSPITAL_COMMUNITY): Payer: BC Managed Care – PPO | Admitting: Physician Assistant

## 2023-06-01 ENCOUNTER — Encounter (HOSPITAL_COMMUNITY): Payer: Self-pay | Admitting: Neurological Surgery

## 2023-06-01 ENCOUNTER — Encounter (HOSPITAL_COMMUNITY)
Admission: RE | Disposition: A | Discharge status: Home / Self Care | Payer: Self-pay | Source: Home / Self Care | Attending: Neurological Surgery

## 2023-06-01 ENCOUNTER — Inpatient Hospital Stay (HOSPITAL_COMMUNITY): Payer: BC Managed Care – PPO

## 2023-06-01 DIAGNOSIS — Z87891 Personal history of nicotine dependence: Secondary | ICD-10-CM

## 2023-06-01 DIAGNOSIS — E7800 Pure hypercholesterolemia, unspecified: Secondary | ICD-10-CM | POA: Diagnosis present

## 2023-06-01 DIAGNOSIS — Z7984 Long term (current) use of oral hypoglycemic drugs: Secondary | ICD-10-CM

## 2023-06-01 DIAGNOSIS — M4722 Other spondylosis with radiculopathy, cervical region: Principal | ICD-10-CM | POA: Diagnosis present

## 2023-06-01 DIAGNOSIS — M5412 Radiculopathy, cervical region: Principal | ICD-10-CM | POA: Diagnosis present

## 2023-06-01 DIAGNOSIS — E1142 Type 2 diabetes mellitus with diabetic polyneuropathy: Secondary | ICD-10-CM | POA: Diagnosis present

## 2023-06-01 DIAGNOSIS — Z8249 Family history of ischemic heart disease and other diseases of the circulatory system: Secondary | ICD-10-CM

## 2023-06-01 DIAGNOSIS — Z7985 Long-term (current) use of injectable non-insulin antidiabetic drugs: Secondary | ICD-10-CM

## 2023-06-01 DIAGNOSIS — G35 Multiple sclerosis: Secondary | ICD-10-CM | POA: Diagnosis present

## 2023-06-01 DIAGNOSIS — Z79899 Other long term (current) drug therapy: Secondary | ICD-10-CM

## 2023-06-01 DIAGNOSIS — F419 Anxiety disorder, unspecified: Secondary | ICD-10-CM | POA: Diagnosis present

## 2023-06-01 DIAGNOSIS — Z833 Family history of diabetes mellitus: Secondary | ICD-10-CM

## 2023-06-01 DIAGNOSIS — Z7982 Long term (current) use of aspirin: Secondary | ICD-10-CM

## 2023-06-01 DIAGNOSIS — Z8673 Personal history of transient ischemic attack (TIA), and cerebral infarction without residual deficits: Secondary | ICD-10-CM

## 2023-06-01 DIAGNOSIS — K219 Gastro-esophageal reflux disease without esophagitis: Secondary | ICD-10-CM | POA: Diagnosis present

## 2023-06-01 DIAGNOSIS — M4802 Spinal stenosis, cervical region: Secondary | ICD-10-CM | POA: Diagnosis present

## 2023-06-01 HISTORY — PX: ANTERIOR CERVICAL DECOMPRESSION/DISCECTOMY FUSION 4 LEVELS: SHX5556

## 2023-06-01 LAB — GLUCOSE, CAPILLARY
Glucose-Capillary: 87 mg/dL (ref 70–99)
Glucose-Capillary: 181 mg/dL — ABNORMAL HIGH (ref 70–99)
Glucose-Capillary: 198 mg/dL — ABNORMAL HIGH (ref 70–99)
Glucose-Capillary: 241 mg/dL — ABNORMAL HIGH (ref 70–99)

## 2023-06-01 LAB — ABO/RH: ABO/RH(D): O POS

## 2023-06-01 SURGERY — ANTERIOR CERVICAL DECOMPRESSION/DISCECTOMY FUSION 4 LEVELS
Anesthesia: General

## 2023-06-01 MED ORDER — FENTANYL CITRATE (PF) 250 MCG/5ML IJ SOLN
INTRAMUSCULAR | Status: DC | PRN
  Administered 2023-06-01 (×2): 50 ug via INTRAVENOUS
  Administered 2023-06-01 (×2): 100 ug via INTRAVENOUS
  Administered 2023-06-01 (×2): 50 ug via INTRAVENOUS
  Administered 2023-06-01: 100 ug via INTRAVENOUS

## 2023-06-01 MED ORDER — CEFAZOLIN SODIUM-DEXTROSE 2-4 GM/100ML-% IV SOLN
2.0000 g | Freq: Three times a day (TID) | INTRAVENOUS | Status: AC
  Administered 2023-06-01 (×2): 2 g via INTRAVENOUS
  Filled 2023-06-01 (×2): qty 100

## 2023-06-01 MED ORDER — NORTRIPTYLINE HCL 10 MG PO CAPS
10.0000 mg | ORAL_CAPSULE | Freq: Every day | ORAL | Status: DC
  Administered 2023-06-01: 10 mg via ORAL
  Filled 2023-06-01 (×2): qty 1

## 2023-06-01 MED ORDER — INSULIN ASPART 100 UNIT/ML IJ SOLN: 0.0000 [IU] | Freq: Three times a day (TID) | INTRAMUSCULAR | Status: DC

## 2023-06-01 MED ORDER — CHLORHEXIDINE GLUCONATE CLOTH 2 % EX PADS: 6.0000 | MEDICATED_PAD | Freq: Once | CUTANEOUS | Status: DC

## 2023-06-01 MED ORDER — PROPOFOL 10 MG/ML IV BOLUS
INTRAVENOUS | Status: AC
  Filled 2023-06-01: qty 20

## 2023-06-01 MED ORDER — CHLORHEXIDINE GLUCONATE 0.12 % MT SOLN: 15.0000 mL | Freq: Once | OROMUCOSAL | Status: AC

## 2023-06-01 MED ORDER — SUGAMMADEX SODIUM 200 MG/2ML IV SOLN
INTRAVENOUS | Status: DC | PRN
  Administered 2023-06-01: 200 mg via INTRAVENOUS

## 2023-06-01 MED ORDER — FLEET ENEMA RE ENEM: 1.0000 | ENEMA | Freq: Once | RECTAL | Status: DC | PRN

## 2023-06-01 MED ORDER — KETOROLAC TROMETHAMINE 15 MG/ML IJ SOLN
15.0000 mg | Freq: Four times a day (QID) | INTRAMUSCULAR | Status: DC
  Administered 2023-06-01 – 2023-06-02 (×3): 15 mg via INTRAVENOUS
  Filled 2023-06-01 (×3): qty 1

## 2023-06-01 MED ORDER — ACETAMINOPHEN 500 MG PO TABS
1000.0000 mg | ORAL_TABLET | Freq: Once | ORAL | Status: AC
  Administered 2023-06-01: 1000 mg via ORAL
  Filled 2023-06-01: qty 2

## 2023-06-01 MED ORDER — FENTANYL CITRATE (PF) 250 MCG/5ML IJ SOLN
INTRAMUSCULAR | Status: AC
  Filled 2023-06-01: qty 5

## 2023-06-01 MED ORDER — INSULIN ASPART 100 UNIT/ML IJ SOLN: 0.0000 [IU] | INTRAMUSCULAR | Status: DC | PRN

## 2023-06-01 MED ORDER — MIDAZOLAM HCL 2 MG/2ML IJ SOLN: 0.5000 mg | Freq: Once | INTRAMUSCULAR | Status: DC | PRN

## 2023-06-01 MED ORDER — ROCURONIUM BROMIDE 10 MG/ML (PF) SYRINGE
PREFILLED_SYRINGE | INTRAVENOUS | Status: DC | PRN
  Administered 2023-06-01: 60 mg via INTRAVENOUS
  Administered 2023-06-01 (×2): 20 mg via INTRAVENOUS

## 2023-06-01 MED ORDER — CEFAZOLIN SODIUM-DEXTROSE 2-4 GM/100ML-% IV SOLN
2.0000 g | INTRAVENOUS | Status: AC
  Administered 2023-06-01: 2 g via INTRAVENOUS
  Filled 2023-06-01: qty 100

## 2023-06-01 MED ORDER — MEPERIDINE HCL 25 MG/ML IJ SOLN: 6.2500 mg | INTRAMUSCULAR | Status: DC | PRN

## 2023-06-01 MED ORDER — ORAL CARE MOUTH RINSE: 15.0000 mL | Freq: Once | OROMUCOSAL | Status: AC

## 2023-06-01 MED ORDER — GLIPIZIDE ER 10 MG PO TB24
10.0000 mg | ORAL_TABLET | Freq: Two times a day (BID) | ORAL | Status: DC
  Administered 2023-06-01: 10 mg via ORAL
  Filled 2023-06-01 (×3): qty 1

## 2023-06-01 MED ORDER — THROMBIN 5000 UNITS EX SOLR
OROMUCOSAL | Status: DC | PRN
  Administered 2023-06-01: 5 mL via TOPICAL

## 2023-06-01 MED ORDER — POLYETHYLENE GLYCOL 3350 17 G PO PACK: 17.0000 g | PACK | Freq: Every day | ORAL | Status: DC | PRN

## 2023-06-01 MED ORDER — LIDOCAINE 2% (20 MG/ML) 5 ML SYRINGE
INTRAMUSCULAR | Status: DC | PRN
  Administered 2023-06-01: 100 mg via INTRAVENOUS

## 2023-06-01 MED ORDER — THROMBIN 5000 UNITS EX SOLR
CUTANEOUS | Status: AC
  Filled 2023-06-01: qty 5000

## 2023-06-01 MED ORDER — HYDROMORPHONE HCL 1 MG/ML IJ SOLN: 0.5000 mg | INTRAMUSCULAR | Status: DC | PRN

## 2023-06-01 MED ORDER — HYDROMORPHONE HCL 1 MG/ML IJ SOLN
INTRAMUSCULAR | Status: AC
  Filled 2023-06-01: qty 1

## 2023-06-01 MED ORDER — 0.9 % SODIUM CHLORIDE (POUR BTL) OPTIME
TOPICAL | Status: DC | PRN
  Administered 2023-06-01: 1000 mL

## 2023-06-01 MED ORDER — ONDANSETRON HCL 4 MG/2ML IJ SOLN
INTRAMUSCULAR | Status: AC
  Filled 2023-06-01: qty 2

## 2023-06-01 MED ORDER — DEXAMETHASONE SODIUM PHOSPHATE 10 MG/ML IJ SOLN
INTRAMUSCULAR | Status: DC | PRN
  Administered 2023-06-01: 10 mg via INTRAVENOUS

## 2023-06-01 MED ORDER — ATORVASTATIN CALCIUM 10 MG PO TABS
20.0000 mg | ORAL_TABLET | Freq: Every day | ORAL | Status: DC
  Administered 2023-06-01: 20 mg via ORAL
  Filled 2023-06-01: qty 2

## 2023-06-01 MED ORDER — DOCUSATE SODIUM 100 MG PO CAPS
100.0000 mg | ORAL_CAPSULE | Freq: Two times a day (BID) | ORAL | Status: DC
  Administered 2023-06-01 – 2023-06-02 (×2): 100 mg via ORAL
  Filled 2023-06-01 (×2): qty 1

## 2023-06-01 MED ORDER — ONDANSETRON HCL 4 MG PO TABS: 4.0000 mg | ORAL_TABLET | Freq: Four times a day (QID) | ORAL | Status: DC | PRN

## 2023-06-01 MED ORDER — DICYCLOMINE HCL 10 MG PO CAPS: 10.0000 mg | ORAL_CAPSULE | Freq: Two times a day (BID) | ORAL | Status: DC

## 2023-06-01 MED ORDER — DEXAMETHASONE SODIUM PHOSPHATE 10 MG/ML IJ SOLN
INTRAMUSCULAR | Status: AC
  Filled 2023-06-01: qty 1

## 2023-06-01 MED ORDER — LISINOPRIL 10 MG PO TABS
5.0000 mg | ORAL_TABLET | Freq: Every day | ORAL | Status: DC
  Filled 2023-06-01: qty 1

## 2023-06-01 MED ORDER — INSULIN ASPART 100 UNIT/ML IJ SOLN
0.0000 [IU] | Freq: Every day | INTRAMUSCULAR | Status: DC
  Administered 2023-06-01: 2 [IU] via SUBCUTANEOUS

## 2023-06-01 MED ORDER — PHENOL 1.4 % MT LIQD
1.0000 | OROMUCOSAL | Status: DC | PRN
  Administered 2023-06-01: 1 via OROMUCOSAL
  Filled 2023-06-01: qty 177

## 2023-06-01 MED ORDER — MENTHOL 3 MG MT LOZG: 1.0000 | LOZENGE | OROMUCOSAL | Status: DC | PRN

## 2023-06-01 MED ORDER — METHOCARBAMOL 1000 MG/10ML IJ SOLN: 500.0000 mg | Freq: Four times a day (QID) | INTRAVENOUS | Status: DC | PRN

## 2023-06-01 MED ORDER — LACTATED RINGERS IV SOLN: INTRAVENOUS | Status: DC

## 2023-06-01 MED ORDER — LIDOCAINE 2% (20 MG/ML) 5 ML SYRINGE
INTRAMUSCULAR | Status: AC
  Filled 2023-06-01: qty 5

## 2023-06-01 MED ORDER — ONDANSETRON HCL 4 MG/2ML IJ SOLN
INTRAMUSCULAR | Status: DC | PRN
  Administered 2023-06-01: 4 mg via INTRAVENOUS

## 2023-06-01 MED ORDER — SODIUM CHLORIDE 0.9 % IV SOLN
250.0000 mL | INTRAVENOUS | Status: DC
  Administered 2023-06-01: 250 mL via INTRAVENOUS

## 2023-06-01 MED ORDER — PIOGLITAZONE HCL 15 MG PO TABS
45.0000 mg | ORAL_TABLET | Freq: Every day | ORAL | Status: DC
  Administered 2023-06-01: 45 mg via ORAL
  Filled 2023-06-01: qty 3

## 2023-06-01 MED ORDER — MIDAZOLAM HCL 2 MG/2ML IJ SOLN
INTRAMUSCULAR | Status: DC | PRN
  Administered 2023-06-01: 2 mg via INTRAVENOUS

## 2023-06-01 MED ORDER — GLIPIZIDE ER 10 MG PO TB24
10.0000 mg | ORAL_TABLET | Freq: Two times a day (BID) | ORAL | Status: DC
  Filled 2023-06-01: qty 1

## 2023-06-01 MED ORDER — LACTATED RINGERS IV SOLN: INTRAVENOUS | Status: DC | PRN

## 2023-06-01 MED ORDER — OXYCODONE HCL 5 MG PO TABS
5.0000 mg | ORAL_TABLET | Freq: Once | ORAL | Status: AC | PRN
  Administered 2023-06-01: 5 mg via ORAL

## 2023-06-01 MED ORDER — HYDROMORPHONE HCL 1 MG/ML IJ SOLN
0.2500 mg | INTRAMUSCULAR | Status: DC | PRN
  Administered 2023-06-01 (×4): 0.5 mg via INTRAVENOUS

## 2023-06-01 MED ORDER — OXYCODONE HCL 5 MG PO TABS
ORAL_TABLET | ORAL | Status: AC
  Filled 2023-06-01: qty 1

## 2023-06-01 MED ORDER — METFORMIN HCL 500 MG PO TABS
1000.0000 mg | ORAL_TABLET | Freq: Two times a day (BID) | ORAL | Status: DC
  Administered 2023-06-01 – 2023-06-02 (×2): 1000 mg via ORAL
  Filled 2023-06-01 (×2): qty 2

## 2023-06-01 MED ORDER — SCOPOLAMINE 1 MG/3DAYS TD PT72
1.0000 | MEDICATED_PATCH | TRANSDERMAL | Status: DC
  Administered 2023-06-01: 1.5 mg via TRANSDERMAL
  Filled 2023-06-01: qty 1

## 2023-06-01 MED ORDER — ACETAMINOPHEN 650 MG RE SUPP: 650.0000 mg | RECTAL | Status: DC | PRN

## 2023-06-01 MED ORDER — PROPOFOL 10 MG/ML IV BOLUS
INTRAVENOUS | Status: DC | PRN
Start: 2023-06-01 — End: 2023-06-01
  Administered 2023-06-01: 50 mg via INTRAVENOUS
  Administered 2023-06-01: 150 mg via INTRAVENOUS

## 2023-06-01 MED ORDER — OXYCODONE HCL 5 MG/5ML PO SOLN: 5.0000 mg | Freq: Once | ORAL | Status: AC | PRN

## 2023-06-01 MED ORDER — ACETAMINOPHEN 325 MG PO TABS: 650.0000 mg | ORAL_TABLET | ORAL | Status: DC | PRN

## 2023-06-01 MED ORDER — CHLORHEXIDINE GLUCONATE 0.12 % MT SOLN
OROMUCOSAL | Status: AC
  Administered 2023-06-01: 15 mL via OROMUCOSAL
  Filled 2023-06-01: qty 15

## 2023-06-01 MED ORDER — SODIUM CHLORIDE 0.9% FLUSH: 3.0000 mL | Freq: Two times a day (BID) | INTRAVENOUS | Status: DC

## 2023-06-01 MED ORDER — PANTOPRAZOLE SODIUM 40 MG PO TBEC
40.0000 mg | DELAYED_RELEASE_TABLET | Freq: Every day | ORAL | Status: DC
  Administered 2023-06-02: 40 mg via ORAL
  Filled 2023-06-01: qty 1

## 2023-06-01 MED ORDER — METFORMIN HCL 500 MG PO TABS: 500.0000 mg | ORAL_TABLET | Freq: Every day | ORAL | Status: DC

## 2023-06-01 MED ORDER — SODIUM CHLORIDE 0.9% FLUSH: 3.0000 mL | INTRAVENOUS | Status: DC | PRN

## 2023-06-01 MED ORDER — PROMETHAZINE HCL 25 MG/ML IJ SOLN: 6.2500 mg | INTRAMUSCULAR | Status: DC | PRN

## 2023-06-01 MED ORDER — HYDROCODONE-ACETAMINOPHEN 5-325 MG PO TABS
2.0000 | ORAL_TABLET | ORAL | Status: DC | PRN
  Administered 2023-06-01 – 2023-06-02 (×2): 2 via ORAL
  Filled 2023-06-01 (×2): qty 2

## 2023-06-01 MED ORDER — ONDANSETRON HCL 4 MG/2ML IJ SOLN: 4.0000 mg | Freq: Four times a day (QID) | INTRAMUSCULAR | Status: DC | PRN

## 2023-06-01 MED ORDER — HYDROCODONE-ACETAMINOPHEN 5-325 MG PO TABS
1.0000 | ORAL_TABLET | ORAL | Status: DC | PRN
  Administered 2023-06-01 – 2023-06-02 (×2): 1 via ORAL
  Filled 2023-06-01 (×2): qty 1

## 2023-06-01 MED ORDER — TRAZODONE HCL 50 MG PO TABS
50.0000 mg | ORAL_TABLET | Freq: Every day | ORAL | Status: DC
  Administered 2023-06-01: 50 mg via ORAL
  Filled 2023-06-01: qty 1

## 2023-06-01 MED ORDER — DULOXETINE HCL 30 MG PO CPEP
60.0000 mg | ORAL_CAPSULE | Freq: Every day | ORAL | Status: DC
  Administered 2023-06-02: 60 mg via ORAL
  Filled 2023-06-01: qty 2

## 2023-06-01 MED ORDER — METHOCARBAMOL 500 MG PO TABS
500.0000 mg | ORAL_TABLET | Freq: Four times a day (QID) | ORAL | Status: DC | PRN
  Administered 2023-06-01: 500 mg via ORAL
  Filled 2023-06-01: qty 1

## 2023-06-01 SURGICAL SUPPLY — 59 items
APL SKNCLS STERI-STRIP NONHPOA (GAUZE/BANDAGES/DRESSINGS) ×1
BAG COUNTER SPONGE SURGICOUNT (BAG) ×2 IMPLANT
BAG SPNG CNTER NS LX DISP (BAG) ×1
BENZOIN TINCTURE PRP APPL 2/3 (GAUZE/BANDAGES/DRESSINGS) IMPLANT
BIT DRILL NEURO 2X3.1 SFT TUCH (MISCELLANEOUS) ×2 IMPLANT
BLADE CLIPPER SURG (BLADE) IMPLANT
BUR CARBIDE MATCH 3.0 (BURR) ×2 IMPLANT
CANISTER SUCT 3000ML PPV (MISCELLANEOUS) ×2 IMPLANT
CLSR STERI-STRIP ANTIMIC 1/2X4 (GAUZE/BANDAGES/DRESSINGS) IMPLANT
COVER MAYO STAND STRL (DRAPES) ×2 IMPLANT
DRAPE C-ARM 42X72 X-RAY (DRAPES) ×2 IMPLANT
DRAPE HALF SHEET 40X57 (DRAPES) IMPLANT
DRAPE LAPAROTOMY 100X72X124 (DRAPES) ×2 IMPLANT
DRAPE MICROSCOPE SLANT 54X150 (MISCELLANEOUS) ×2 IMPLANT
DRILL NEURO 2X3.1 SOFT TOUCH (MISCELLANEOUS) ×1
DRSG OPSITE POSTOP 4X6 (GAUZE/BANDAGES/DRESSINGS) IMPLANT
DURAPREP 26ML APPLICATOR (WOUND CARE) IMPLANT
DURAPREP 6ML APPLICATOR 50/CS (WOUND CARE) ×2 IMPLANT
ELECT COATED BLADE 2.86 ST (ELECTRODE) ×2 IMPLANT
ELECT REM PT RETURN 9FT ADLT (ELECTROSURGICAL) ×1
ELECTRODE REM PT RTRN 9FT ADLT (ELECTROSURGICAL) ×2 IMPLANT
EVACUATOR 1/8 PVC DRAIN (DRAIN) IMPLANT
GAUZE 4X4 16PLY ~~LOC~~+RFID DBL (SPONGE) IMPLANT
GLOVE BIO SURGEON STRL SZ7 (GLOVE) ×2 IMPLANT
GLOVE BIOGEL PI IND STRL 7.5 (GLOVE) ×2 IMPLANT
GLOVE BIOGEL PI IND STRL 8 (GLOVE) ×2 IMPLANT
GLOVE ECLIPSE 8.0 STRL XLNG CF (GLOVE) ×4 IMPLANT
GLOVE EXAM NITRILE LRG STRL (GLOVE) IMPLANT
GLOVE EXAM NITRILE XL STR (GLOVE) IMPLANT
GLOVE EXAM NITRILE XS STR PU (GLOVE) IMPLANT
GOWN STRL REUS W/ TWL LRG LVL3 (GOWN DISPOSABLE) IMPLANT
GOWN STRL REUS W/ TWL XL LVL3 (GOWN DISPOSABLE) ×4 IMPLANT
GOWN STRL REUS W/TWL 2XL LVL3 (GOWN DISPOSABLE) IMPLANT
GOWN STRL REUS W/TWL LRG LVL3 (GOWN DISPOSABLE)
GOWN STRL REUS W/TWL XL LVL3 (GOWN DISPOSABLE) ×2
HEMOSTAT POWDER KIT SURGIFOAM (HEMOSTASIS) ×2 IMPLANT
IMPL CERV LORD 12X14X6 7D (Neuro Prosthesis/Implant) IMPLANT
IMPLANT CERV LORD 12X14X6 7D (Neuro Prosthesis/Implant) ×1 IMPLANT
INTERBODY CASC CERV 12X14X5 7D (Cage) IMPLANT
KIT BASIN OR (CUSTOM PROCEDURE TRAY) ×2 IMPLANT
KIT TURNOVER KIT B (KITS) ×2 IMPLANT
NDL SPNL 18GX3.5 QUINCKE PK (NEEDLE) ×2 IMPLANT
NEEDLE SPNL 18GX3.5 QUINCKE PK (NEEDLE) ×1
NS IRRIG 1000ML POUR BTL (IV SOLUTION) ×2 IMPLANT
PACK LAMINECTOMY NEURO (CUSTOM PROCEDURE TRAY) ×2 IMPLANT
PAD ARMBOARD 7.5X6 YLW CONV (MISCELLANEOUS) ×6 IMPLANT
PIN DISTRACTION 14MM (PIN) ×4 IMPLANT
PLATE ACP 64 4L (Plate) IMPLANT
PUTTY BONE 100 VESUVIUS 2.5CC (Putty) IMPLANT
SCREW ST FIXED ACS 4X15 (Screw) IMPLANT
SCREW ST VA ACS 4X15 NS (Screw) IMPLANT
SPONGE INTESTINAL PEANUT (DISPOSABLE) ×2 IMPLANT
SPONGE SURGIFOAM ABS GEL SZ50 (HEMOSTASIS) ×2 IMPLANT
STAPLER VISISTAT 35W (STAPLE) IMPLANT
STRIP CLOSURE SKIN 1/2X4 (GAUZE/BANDAGES/DRESSINGS) ×2 IMPLANT
TAPE SURG TRANSPORE 1 IN (GAUZE/BANDAGES/DRESSINGS) ×2 IMPLANT
TOWEL GREEN STERILE (TOWEL DISPOSABLE) ×2 IMPLANT
TOWEL GREEN STERILE FF (TOWEL DISPOSABLE) ×2 IMPLANT
WATER STERILE IRR 1000ML POUR (IV SOLUTION) ×2 IMPLANT

## 2023-06-01 NOTE — Op Note (Signed)
Providing Compassionate, Quality Care - Together  Date of service: 06/01/2023  PREOP DIAGNOSIS: Cervical spondylosis with stenosis, foraminal stenosis and radiculopathy, C3-4, C4-5, C5-6, C6-7  POSTOP DIAGNOSIS: Same  PROCEDURE: 1. Arthrodesis C3-4, C4-5, C5-6, C6-7, anterior interbody technique  2. Placement of intervertebral biomechanical device C3-4:K3m [place] titanium 5 x 14 mm interbody; C4-5 6 x 14 mm titanium interbody, C5-6: 5 x 14 mm titanium interbody, C6-7: 5 x 14 mm titanium interbody 3. Placement of anterior instrumentation consisting of interbody plate and screws - [place] 64 mm plate; C3 4.0 x 50 mm screws bilaterally, C4 4.0 x 15 mm screws bilaterally, C5 4.0 x 15 mm screws bilaterally, C5-6 4.0 x 15 mm screws bilaterally, C7 4.0 x 15 mm screws bilaterally 4. Discectomy at C3-4, C4-5, C5-6, C6-7 for decompression of spinal cord and exiting nerve roots  5. Use of morselized bone allograft  6. Use of intraoperative microscope 7. Use of autograft, same incision  SURGEON: Dr. Kendell Bane , DO  ASSISTANT: Patrici Ranks, PA  ANESTHESIA: General Endotracheal  EBL: 50 cc  SPECIMENS: None  DRAINS: Medium Hemovac  COMPLICATIONS: None immediate  CONDITION: Hemodynamically stable to PACU  HISTORY: Joanne Reed is a 52 y.o. y.o. female who initially presented to the outpatient clinic with signs and symptoms consistent with bilateral right greater than left radiculopathy. MRI demonstrated severe cervical spondylosis at C3-4, C4-5, C5-6, C6-7 with moderate to severe foraminal stenosis at every level.  She failed conservative measures and therefore I offered her surgical intervention in the form of an ACDF C3-7. Treatment options were discussed including continuing pain control, further epidural injections and surgical intervention. After all questions were answered, informed consent was obtained.    PROCEDURE IN DETAIL: The patient was brought to the operating room and  transferred to the operative table. After induction of general anesthesia, the patient was positioned on the operative table in the supine position with all pressure points meticulously padded. The skin of the neck was then prepped and draped in the usual sterile fashion.  Physician driven timeout was performed.  After timeout was conducted, skin incision was then made sharply with a 10 blade and Bovie electrocautery was used to dissect the subcutaneous tissue until the platysma was identified. The platysma was then divided and undermined. The sternocleidomastoid muscle was then identified and, utilizing natural fascial planes in the neck, the prevertebral fascia was identified and the carotid sheath was retracted laterally and the trachea and esophagus retracted medially. Again using fluoroscopy, the correct disc space was identified. Bovie electrocautery was used to dissect in the subperiosteal plane and elevate the bilateral longus coli muscles. Self-retaining retractors were then placed under the longus coli muscles bilaterally. At this point, the microscope was draped and brought into the field, and the remainder of the case was done under the microscope using microdissecting technique.  ACDF C3-4: Distraction pins were placed in midline above and below the disc space.  The disc  space was placed in distraction.  The disc space was incised sharply and rongeurs were use to initially complete a discectomy. The high-speed drill was then used to complete discectomy until the posterior annulus was identified and removed and the posterior longitudinal ligament was identified. Using microcurettes, the PLL was elevated, and Kerrison rongeurs were used to remove the posterior longitudinal ligament and the ventral thecal sac was identified. Using a combination of curettes and ronguers, complete decompression of the thecal sac and exiting nerve roots at this level was completed with  bilateral foraminotomies, and  verified using micro-nerve hook. The disc space was taken out of distraction.  Epidural hemostasis was achieved with Surgifoam.    Having completed our decompression, attention was turned to placement of the intervertebral device. Trial spacers were used to select a 5 mm graft. This graft was then filled with autograft and morcellized allograft, and inserted under live fluoroscopy.  ACDF C4-5: Distraction pins were placed in midline above and below the disc space.  The disc  space was placed in distraction.  The disc space was incised sharply and rongeurs were use to initially complete a discectomy. The high-speed drill was then used to complete discectomy until the posterior annulus was identified and removed and the posterior longitudinal ligament was identified. Using microcurettes, the PLL was elevated, and Kerrison rongeurs were used to remove the posterior longitudinal ligament and the ventral thecal sac was identified. Using a combination of curettes and ronguers, complete decompression of the thecal sac and exiting nerve roots at this level was completed with bilateral foraminotomies, and verified using micro-nerve hook. The disc space was taken out of distraction.  Epidural hemostasis was achieved with Surgifoam.  There was a large right sided lateral recess osteophyte that was removed with Kerrison rongeurs.  Having completed our decompression, attention was turned to placement of the intervertebral device. Trial spacers were used to select a 6 mm graft. This graft was then filled with autograft and morcellized allograft, and inserted under live fluoroscopy.  ACDF C5-6: Distraction pins were placed in midline above and below the disc space.  The disc  space was placed in distraction.  The disc space was incised sharply and rongeurs were use to initially complete a discectomy. The high-speed drill was then used to complete discectomy until the posterior annulus was identified and removed and the  posterior longitudinal ligament was identified. Using microcurettes, the PLL was elevated, and Kerrison rongeurs were used to remove the posterior longitudinal ligament and the ventral thecal sac was identified. Using a combination of curettes and ronguers, complete decompression of the thecal sac and exiting nerve roots at this level was completed with bilateral foraminotomies, and verified using micro-nerve hook. The disc space was taken out of distraction.  Epidural hemostasis was achieved with Surgifoam.   Having completed our decompression, attention was turned to placement of the intervertebral device. Trial spacers were used to select a 5 mm graft. This graft was then filled with autograft and morcellized allograft, and inserted under live fluoroscopy.  ACDF C6-7: Distraction pins were placed in midline above and below the disc space.  The disc  space was placed in distraction.  The disc space was incised sharply and rongeurs were use to initially complete a discectomy. The high-speed drill was then used to complete discectomy until the posterior annulus was identified and removed and the posterior longitudinal ligament was identified. Using microcurettes, the PLL was elevated, and Kerrison rongeurs were used to remove the posterior longitudinal ligament and the ventral thecal sac was identified. Using a combination of curettes and ronguers, complete decompression of the thecal sac and exiting nerve roots at this level was completed with bilateral foraminotomies, and verified using micro-nerve hook. The disc space was taken out of distraction.  Epidural hemostasis was achieved with Surgifoam.   Having completed our decompression, attention was turned to placement of the intervertebral device. Trial spacers were used to select a 5 mm graft. This graft was then filled with autograft and morcellized allograft, and inserted under live fluoroscopy.  After placement of the intervertebral device, the above  anterior cervical plate was selected, and placed across the interspaces. Using a high-speed drill, the cortex of the cervical vertebral bodies was punctured, and screws inserted in C3, C4, C5, C6, C7. Final fluoroscopic images in AP and lateral projections were taken to confirm good hardware placement.  The plate was final tightened to the manufacturer's recommendation and the screws were locked in place.  At this point, after all counts were verified to be correct, meticulous hemostasis was secured using a combination of bipolar electrocautery and passive hemostatics.  Medium Hemovac was tunneled laterally and placed in the prevertebral space.  Skin was closed with staples.  Sterile dressing was applied.  The patient tolerated the procedure well and was extubated in the room and taken to the postanesthesia care unit in stable condition.

## 2023-06-01 NOTE — Transfer of Care (Signed)
Immediate Anesthesia Transfer of Care Note  Patient: Joanne Reed  Procedure(s) Performed: ACDF [phone]  Patient Location: PACU  Anesthesia Type:General  Level of Consciousness: awake, alert , oriented, and patient cooperative  Airway & Oxygen Therapy: Patient Spontanous Breathing and Patient connected to face mask oxygen  Post-op Assessment: Report given to RN, Post -op Vital signs reviewed and stable, Patient moving all extremities X 4, and Patient able to stick tongue midline  Post vital signs: Reviewed and stable  Last Vitals:  Vitals Value Taken Time  BP 115/62 06/01/23 1200  Temp    Pulse 104 06/01/23 1201  Resp 12 06/01/23 1201  SpO2 96 % 06/01/23 1201  Vitals shown include unfiled device data.  Last Pain:  Vitals:   06/01/23 0604  TempSrc:   PainSc: 4       Patients Stated Pain Goal: 0 (06/01/23 0604)  Complications: No notable events documented.

## 2023-06-01 NOTE — Anesthesia Postprocedure Evaluation (Signed)
Anesthesia Post Note  Patient: Joanne Reed  Procedure(s) Performed: ACDF [phone]     Patient location during evaluation: PACU Anesthesia Type: General Level of consciousness: patient cooperative, sedated and oriented Pain management: pain level controlled (more comfortable) Vital Signs Assessment: post-procedure vital signs reviewed and stable Respiratory status: spontaneous breathing, nonlabored ventilation, respiratory function stable and patient connected to nasal cannula oxygen Cardiovascular status: blood pressure returned to baseline and stable Postop Assessment: no apparent nausea or vomiting Anesthetic complications: no   No notable events documented.  Last Vitals:  Vitals:   06/01/23 1300 06/01/23 1349  BP: (!) 108/49 (!) 106/50  Pulse: 87 92  Resp: 15 18  Temp: 36.4 C 36.7 C  SpO2: 92% 97%    Last Pain:  Vitals:   06/01/23 1349  TempSrc: Oral  PainSc:                  Gaige Sebo,E. Rodina Pinales

## 2023-06-01 NOTE — H&P (Signed)
Providing Compassionate, Quality Care - Together  NEUROSURGERY HISTORY & PHYSICAL   Joanne Reed is an 52 y.o. female.   Chief Complaint: Radiculopathy, cervical HPI: This is a 52 year old female with worsening neck pain, bilateral upper extremity radiculopathy.  MRI revealed multilevel cervical stenosis C3-7 with foraminal stenosis.  Her symptomatology progressed, despite conservative measures including epidural injections.  She presents today for surgical intervention in hopes to improve her symptoms.  She received medical clearance, her type 2 diabetes is well-controlled.  Past Medical History:  Diagnosis Date   Abnormal finding on MRI of brain 01/01/2020   Anxiety    Arthritis    Depression    Diabetes mellitus without complication (HCC)    GERD (gastroesophageal reflux disease)    History of kidney stones    hanging out in the kidney, no problems   Hypercholesteremia    Hyperlipidemia    Migraine    MS (multiple sclerosis) (HCC)    Stroke (HCC)    Thyromegaly 09/20/2013   TIA (transient ischemic attack) 2021   08/24/20   Uncontrolled type 2 diabetes mellitus with diabetic polyneuropathy, without long-term current use of insulin 07/10/2017    Past Surgical History:  Procedure Laterality Date   ABDOMINAL HYSTERECTOMY  2009   [facility]--Dr. Brigitte Pulse SECTION  1987, 1990, 1999   x 3   cholecystectomy  2000   COLONOSCOPY  11/14/2007   Dr.Gupta   COLONOSCOPY     x 2   UPPER GI ENDOSCOPY     WISDOM TOOTH EXTRACTION      Family History  Problem Relation Age of Onset   Kidney cancer Mother    Skin cancer Mother        unsure of type - not melanoma - place removed from nose.   Esophageal cancer Father    Hypertension Brother    Diabetes Paternal Grandmother    Colon cancer Neg Hx    Colon polyps Neg Hx    Rectal cancer Neg Hx    Stomach cancer Neg Hx    Social History:  reports that she has quit smoking. Her smoking use included cigarettes. She has never  used smokeless tobacco. She reports that she does not currently use alcohol. She reports that she does not use drugs.  Allergies: No Known Allergies  Medications Prior to Admission  Medication Sig Dispense Refill   aspirin EC 81 MG tablet Take 81 mg by mouth daily.     atorvastatin (LIPITOR) 20 MG tablet Take 20 mg by mouth at bedtime.     DULoxetine (CYMBALTA) 60 MG capsule TAKE ONE CAPSULE BY MOUTH EVERY DAY 90 capsule 4   glipiZIDE (GLUCOTROL XL) 10 MG 24 hr tablet Take 10 mg by mouth 2 (two) times daily.     lisinopril (ZESTRIL) 5 MG tablet Take 5 mg by mouth at bedtime.     metFORMIN (GLUCOPHAGE) 500 MG tablet Take 500-1,000 mg by mouth See admin instructions. Take 1000 mg at breakfast  and dinner and 500 mg at lunch     MOUNJARO 7.5 MG/0.5ML Pen Inject 7.5 mg into the skin once a week. Wednesday     pantoprazole (PROTONIX) 40 MG tablet TAKE 1 TABLET BY MOUTH EVERY DAY 90 tablet 1   pioglitazone (ACTOS) 45 MG tablet Take 45 mg by mouth at bedtime.     traZODone (DESYREL) 50 MG tablet Take 50 mg by mouth at bedtime.     ALPRAZolam (XANAX) 1 MG tablet Take 1-2  tablets thirty minutes prior to MRI.  May take one additional tablet before entering scanner, if needed.  MUST HAVE DRIVER. (Patient taking differently: Take 1-2 mg by mouth as needed (MRI). Take 1-2 tablets thirty minutes prior to MRI.  May take one additional tablet before entering scanner, if needed.  MUST HAVE DRIVER.) 3 tablet 0   dicyclomine (BENTYL) 10 MG capsule TAKE ONE CAPSULE BY MOUTH 2 TIMES A DAY (Patient not taking: Reported on 05/19/2023) 60 capsule 1   nortriptyline (PAMELOR) 10 MG capsule 2 at bedtime (Patient not taking: Reported on 05/19/2023) 60 capsule 0    Results for orders placed or performed during the hospital encounter of 06/01/23 (from the past 48 hour(s))  Glucose, capillary     Status: None   Collection Time: 06/01/23  5:50 AM  Result Value Ref Range   Glucose-Capillary 87 70 - 99 mg/dL    Comment: Glucose  reference range applies only to samples taken after fasting for at least 8 hours.   No results found.  ROS All pertinent positives and negatives are listed in HPI above  Blood pressure (!) 100/58, pulse 70, temperature 97.9 F (36.6 C), temperature source Oral, resp. rate 18, height 5\' 2"  (1.575 m), weight 94.8 kg, SpO2 98%. Physical Exam  Awake alert oriented x 3 PERRLA Bilateral upper extremities 5/5 Bilateral lower extremities 5/5 Nonlabored breathing Speech fluent and appropriate EOMI Face symmetric    Assessment/Plan 52 year old female with  C3-7 cervical spondylosis, stenosis with radiculopathy  -OR today for C3-7 ACDF.  We discussed all risks, benefits and expected outcomes as well as alternatives to treatment.  She failed multiple conservative measures.  Informed consent was obtained and witnessed.  Medical clearance was obtained.   Thank you for allowing me to participate in this patient's care.  Please do not hesitate to call with questions or concerns.   Monia Pouch, DO Neurosurgeon [facility] Cell: [phone]

## 2023-06-01 NOTE — Anesthesia Procedure Notes (Signed)
Procedure Name: Intubation Date/Time: 06/01/2023 7:55 AM  Performed by: Jairo Ben, MDPre-anesthesia Checklist: Patient identified, Emergency Drugs available, Suction available and Patient being monitored Patient Re-evaluated:Patient Re-evaluated prior to induction Oxygen Delivery Method: Circle system utilized Preoxygenation: Pre-oxygenation with 100% oxygen Induction Type: IV induction Ventilation: Mask ventilation without difficulty Laryngoscope Size: Glidescope and 3 Grade View: Grade I Tube type: Oral Tube size: 7.5 mm Number of attempts: 1 Airway Equipment and Method: Stylet and Oral airway Placement Confirmation: ETT inserted through vocal cords under direct vision, positive ETCO2 and breath sounds checked- equal and bilateral Tube secured with: Tape Dental Injury: Teeth and Oropharynx as per pre-operative assessment

## 2023-06-01 NOTE — Progress Notes (Signed)
Orthopedic Tech Progress Note Patient Details:  Joanne Reed 1971-08-23 [id number] Dropped off aspen cervical collar per order.  Ortho Devices Type of Ortho Device: Aspen cervical collar Ortho Device/Splint Interventions: Ordered   Post Interventions Instructions Provided: Adjustment of device, Care of device  Blase Mess 06/01/2023, 1:01 PM

## 2023-06-02 DIAGNOSIS — Z87891 Personal history of nicotine dependence: Secondary | ICD-10-CM | POA: Diagnosis not present

## 2023-06-02 DIAGNOSIS — E7800 Pure hypercholesterolemia, unspecified: Secondary | ICD-10-CM | POA: Diagnosis present

## 2023-06-02 DIAGNOSIS — Z8249 Family history of ischemic heart disease and other diseases of the circulatory system: Secondary | ICD-10-CM | POA: Diagnosis not present

## 2023-06-02 DIAGNOSIS — Z7985 Long-term (current) use of injectable non-insulin antidiabetic drugs: Secondary | ICD-10-CM | POA: Diagnosis not present

## 2023-06-02 DIAGNOSIS — G35 Multiple sclerosis: Secondary | ICD-10-CM | POA: Diagnosis present

## 2023-06-02 DIAGNOSIS — K219 Gastro-esophageal reflux disease without esophagitis: Secondary | ICD-10-CM | POA: Diagnosis present

## 2023-06-02 DIAGNOSIS — Z833 Family history of diabetes mellitus: Secondary | ICD-10-CM | POA: Diagnosis not present

## 2023-06-02 DIAGNOSIS — M4722 Other spondylosis with radiculopathy, cervical region: Secondary | ICD-10-CM | POA: Diagnosis present

## 2023-06-02 DIAGNOSIS — E1142 Type 2 diabetes mellitus with diabetic polyneuropathy: Secondary | ICD-10-CM | POA: Diagnosis present

## 2023-06-02 DIAGNOSIS — Z7982 Long term (current) use of aspirin: Secondary | ICD-10-CM | POA: Diagnosis not present

## 2023-06-02 DIAGNOSIS — Z79899 Other long term (current) drug therapy: Secondary | ICD-10-CM | POA: Diagnosis not present

## 2023-06-02 DIAGNOSIS — M4802 Spinal stenosis, cervical region: Secondary | ICD-10-CM | POA: Diagnosis present

## 2023-06-02 DIAGNOSIS — Z7984 Long term (current) use of oral hypoglycemic drugs: Secondary | ICD-10-CM | POA: Diagnosis not present

## 2023-06-02 DIAGNOSIS — Z8673 Personal history of transient ischemic attack (TIA), and cerebral infarction without residual deficits: Secondary | ICD-10-CM | POA: Diagnosis not present

## 2023-06-02 DIAGNOSIS — M5412 Radiculopathy, cervical region: Secondary | ICD-10-CM | POA: Diagnosis present

## 2023-06-02 DIAGNOSIS — F419 Anxiety disorder, unspecified: Secondary | ICD-10-CM | POA: Diagnosis present

## 2023-06-02 LAB — GLUCOSE, CAPILLARY: Glucose-Capillary: 118 mg/dL — ABNORMAL HIGH (ref 70–99)

## 2023-06-02 MED ORDER — METHOCARBAMOL 500 MG PO TABS: 500.0000 mg | ORAL_TABLET | Freq: Four times a day (QID) | ORAL | 0 refills | Status: DC | PRN

## 2023-06-02 MED ORDER — HYDROCODONE-ACETAMINOPHEN 5-325 MG PO TABS: 1.0000 | ORAL_TABLET | ORAL | 0 refills | Status: DC | PRN

## 2023-06-02 NOTE — Progress Notes (Signed)
Patient alert and oriented, void, ambulate. Surgical site clean and dry no sign of infection. D/c instructions explain and given  all questions answered. Patient d/c home per order.

## 2023-06-02 NOTE — Plan of Care (Signed)

## 2023-06-02 NOTE — Discharge Summary (Signed)
  Patient ID: Joanne Reed MRN: [id number] DOB/AGE: Oct 23, 1970 52 y.o.  Admit date: 06/01/2023 Discharge date: 06/02/2023  Admission Diagnoses: Cervical spondylosis w/ radiculopathy  Discharge Diagnoses: same   Discharged Condition: Stable  Hospital Course:  Joanne Reed is a 52 y.o. female who was admitted following an uncomplicated ACDF C3-7. They were recovered in PACU and transferred to [facility]. Hospital course was uncomplicated. Pt stable for discharge today. Pt to f/u in office for routine post op visit. Pt is in agreement w/ plan.    Discharge Exam: Blood pressure (!) 94/59, pulse (!) 58, temperature 98.1 F (36.7 C), temperature source Oral, resp. rate 18, height 5\' 2"  (1.575 m), weight 94.8 kg, SpO2 99%. A&O x3 Speech fluent, appropriate Strength 5/5 x4.  SILTx4.  Dressing c/d/I.   Disposition: Discharge disposition: 01-Home or Self Care       Discharge Instructions     Incentive spirometry RT   Complete by: As directed       Allergies as of 06/02/2023   No Known Allergies      Medication List     TAKE these medications    ALPRAZolam 1 MG tablet Commonly known as: XANAX Take 1-2 tablets thirty minutes prior to MRI.  May take one additional tablet before entering scanner, if needed.  MUST HAVE DRIVER. What changed:  how much to take how to take this when to take this reasons to take this   aspirin EC 81 MG tablet Take 1 tablet (81 mg total) by mouth daily. Start taking on: June 08, 2023 What changed: These instructions start on June 08, 2023. If you are unsure what to do until then, ask your doctor or other care provider.   atorvastatin 20 MG tablet Commonly known as: LIPITOR Take 20 mg by mouth at bedtime.   dicyclomine 10 MG capsule Commonly known as: BENTYL TAKE ONE CAPSULE BY MOUTH 2 TIMES A DAY   DULoxetine 60 MG capsule Commonly known as: CYMBALTA TAKE ONE CAPSULE BY MOUTH EVERY DAY   glipiZIDE 10 MG 24 hr tablet Commonly  known as: GLUCOTROL XL Take 10 mg by mouth 2 (two) times daily.   HYDROcodone-acetaminophen 5-325 MG tablet Commonly known as: NORCO/VICODIN Take 1 tablet by mouth every 4 (four) hours as needed for moderate pain ((score 4 to 6)).   lisinopril 5 MG tablet Commonly known as: ZESTRIL Take 5 mg by mouth at bedtime.   metFORMIN 500 MG tablet Commonly known as: GLUCOPHAGE Take 500-1,000 mg by mouth See admin instructions. Take 1000 mg at breakfast  and dinner and 500 mg at lunch   methocarbamol 500 MG tablet Commonly known as: ROBAXIN Take 1 tablet (500 mg total) by mouth every 6 (six) hours as needed for muscle spasms.   Mounjaro 7.5 MG/0.5ML Pen Generic drug: tirzepatide Inject 7.5 mg into the skin once a week. Wednesday   nortriptyline 10 MG capsule Commonly known as: PAMELOR 2 at bedtime   pantoprazole 40 MG tablet Commonly known as: PROTONIX TAKE 1 TABLET BY MOUTH EVERY DAY   pioglitazone 45 MG tablet Commonly known as: ACTOS Take 45 mg by mouth at bedtime.   traZODone 50 MG tablet Commonly known as: DESYREL Take 50 mg by mouth at bedtime.         Signed: Clovis Riley 06/02/2023, 9:46 AM

## 2023-06-02 NOTE — Progress Notes (Signed)
PT Cancellation Note  Patient Details Name: Joanne Reed MRN: [id number] DOB: Jun 08, 1971   Cancelled Treatment:    Reason Eval/Treat Not Completed: PT screened, no needs identified, will sign off. Per discussion with OT the pt is mobilizing independently at this time, has good knowledge of neck precautions, and does not demonstrate the need for acute PT evaluation. PT signing off.   Arlyss Gandy 06/02/2023, 10:11 AM

## 2023-06-02 NOTE — Evaluation (Signed)
Occupational Therapy Evaluation Patient Details Name: Joanne Reed MRN: [id number] DOB: 16-Apr-1971 Today's Date: 06/02/2023   History of Present Illness 52 yo female s/p 8/15 ACDF C3-7 PMH anxiety arthritis DM HLD MS CVA   Clinical Impression   Patient evaluated by Occupational Therapy with no further acute OT needs identified. All education has been completed and the patient has no further questions. See below for any follow-up Occupational Therapy or equipment needs. OT to sign off. Thank you for referral.         If plan is discharge home, recommend the following: Assist for transportation    Functional Status Assessment  Patient has had a recent decline in their functional status and demonstrates the ability to make significant improvements in function in a reasonable and predictable amount of time.  Equipment Recommendations  None recommended by OT    Recommendations for Other Services       Precautions / Restrictions Precautions Precautions: Cervical Precaution Comments: handout provided and reviewed for cervical precautions during adls Required Braces or Orthoses: Cervical Brace Cervical Brace: Hard collar;At all times Restrictions Weight Bearing Restrictions: No      Mobility Bed Mobility Overal bed mobility: Modified Independent                  Transfers Overall transfer level: Modified independent                        Balance Overall balance assessment: No apparent balance deficits (not formally assessed)                                         ADL either performed or assessed with clinical judgement   ADL Overall ADL's : Modified independent                                       General ADL Comments: educated on don doff brace with pt completing the task. pt completed stairs. Pt notified of screen. pt completed bathroom level adl task adequately  Cervical precautions ( handout provided): Educated  patient on don doff brace with return demonstration, educated on oral care using cups, washing face with cloth, never to wash directly on incision site, avoid neck rotation flexion and extension, positioning with pillows in chair for bil UE, sleeping positioning, avoiding pushing / pulling with bil UE,   Pt educated on need to notify doctor / RN of swallowing changes or choking..     Vision Baseline Vision/History: 1 Wears glasses Ability to See in Adequate Light: 0 Adequate Vision Assessment?: No apparent visual deficits     Perception         Praxis         Pertinent Vitals/Pain Pain Assessment Pain Assessment: No/denies pain     Extremity/Trunk Assessment Upper Extremity Assessment Upper Extremity Assessment: Overall WFL for tasks assessed (pt reports no numbness tingling of hands)   Lower Extremity Assessment Lower Extremity Assessment: Overall WFL for tasks assessed   Cervical / Trunk Assessment Cervical / Trunk Assessment: Neck Surgery   Communication Communication Communication: No apparent difficulties   Cognition Arousal: Alert Behavior During Therapy: WFL for tasks assessed/performed Overall Cognitive Status: Within Functional Limits for tasks assessed  General Comments  incision dry and intact    Exercises     Shoulder Instructions      Home Living Family/patient expects to be discharged to:: Private residence Living Arrangements: Spouse/significant other Available Help at Discharge: Family;Friend(s);Available PRN/intermittently Type of Home: House Home Access: Stairs to enter [employer] of Steps: 2 into the house, 6 up and 6 down, must go up to get to bedrooms. Only thing on the main level is kitchen. must go through the yard to get to the downstairs. 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 Does not report back to work until end of FMLA work      Prior Functioning/Environment Prior Level of Function : Independent/Modified Independent;Working/employed;Driving                        OT Problem List:        OT Treatment/Interventions:      OT Goals(Current goals can be found in the care plan section) Acute Rehab OT Goals Patient Stated Goal: to return home  OT Frequency:      Co-evaluation              AM-PAC OT "6 Clicks" Daily Activity     Outcome Measure Help from another person eating meals?: None Help from another person taking care of personal grooming?: None Help from another person toileting, which includes using toliet, bedpan, or urinal?: None Help from another person bathing (including washing, rinsing, drying)?: None Help from another person to put on and taking off regular upper body clothing?: None Help from another person to put on and taking off regular lower body clothing?: None 6 Click Score: 24   End of Session Equipment Utilized During Treatment: Gait belt Nurse Communication: Mobility status  Activity Tolerance: Patient tolerated treatment well Patient left: in chair;with call bell/phone within reach  OT Visit Diagnosis: Unsteadiness on feet (R26.81)                Time: 8295-6213 OT Time Calculation (min): 23 min Charges:  OT General Charges $OT Visit: 1 Visit OT Evaluation $OT Eval Moderate Complexity: 1 Mod   Brynn, OTR/L  Acute Rehabilitation Services Office: [phone] .   Mateo Flow 06/02/2023, 10:02 AM

## 2023-06-06 ENCOUNTER — Encounter (HOSPITAL_COMMUNITY): Payer: Self-pay | Admitting: Neurological Surgery

## 2023-08-02 ENCOUNTER — Other Ambulatory Visit: Payer: Self-pay | Admitting: Neurology

## 2023-10-03 ENCOUNTER — Ambulatory Visit (INDEPENDENT_AMBULATORY_CARE_PROVIDER_SITE_OTHER): Payer: BC Managed Care – PPO | Admitting: Neurology

## 2023-10-03 ENCOUNTER — Encounter: Payer: Self-pay | Admitting: Neurology

## 2023-10-03 VITALS — BP 105/76 | HR 86 | Resp 15 | Ht 63.0 in | Wt 209.0 lb

## 2023-10-03 DIAGNOSIS — G43709 Chronic migraine without aura, not intractable, without status migrainosus: Secondary | ICD-10-CM | POA: Diagnosis not present

## 2023-10-03 DIAGNOSIS — R9089 Other abnormal findings on diagnostic imaging of central nervous system: Secondary | ICD-10-CM | POA: Diagnosis not present

## 2023-10-03 DIAGNOSIS — M47812 Spondylosis without myelopathy or radiculopathy, cervical region: Secondary | ICD-10-CM

## 2023-10-03 MED ORDER — SUMATRIPTAN SUCCINATE 50 MG PO TABS: 50.0000 mg | ORAL_TABLET | ORAL | 5 refills | Status: DC | PRN

## 2023-10-03 MED ORDER — TRAZODONE HCL 150 MG PO TABS: 150.0000 mg | ORAL_TABLET | Freq: Every day | ORAL | 11 refills | Status: AC

## 2023-10-03 MED ORDER — ONDANSETRON 4 MG PO TBDP: 4.0000 mg | ORAL_TABLET | Freq: Three times a day (TID) | ORAL | 6 refills | Status: AC | PRN
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
## 2023-10-21 ENCOUNTER — Observation Stay (HOSPITAL_COMMUNITY)
Admission: EM | Admit: 2023-10-21 | Discharge: 2023-10-22 | Disposition: A | Discharge status: Home / Self Care | Payer: BC Managed Care – PPO | Attending: Family Medicine | Admitting: Family Medicine

## 2023-10-21 ENCOUNTER — Other Ambulatory Visit: Payer: Self-pay

## 2023-10-21 ENCOUNTER — Encounter (HOSPITAL_COMMUNITY): Payer: Self-pay

## 2023-10-21 DIAGNOSIS — E11649 Type 2 diabetes mellitus with hypoglycemia without coma: Secondary | ICD-10-CM | POA: Diagnosis present

## 2023-10-21 DIAGNOSIS — G9341 Metabolic encephalopathy: Secondary | ICD-10-CM | POA: Insufficient documentation

## 2023-10-21 DIAGNOSIS — I10 Essential (primary) hypertension: Secondary | ICD-10-CM | POA: Insufficient documentation

## 2023-10-21 DIAGNOSIS — E119 Type 2 diabetes mellitus without complications: Secondary | ICD-10-CM

## 2023-10-21 DIAGNOSIS — Z87891 Personal history of nicotine dependence: Secondary | ICD-10-CM | POA: Insufficient documentation

## 2023-10-21 DIAGNOSIS — K219 Gastro-esophageal reflux disease without esophagitis: Secondary | ICD-10-CM

## 2023-10-21 DIAGNOSIS — Z7982 Long term (current) use of aspirin: Secondary | ICD-10-CM | POA: Diagnosis not present

## 2023-10-21 DIAGNOSIS — Z794 Long term (current) use of insulin: Secondary | ICD-10-CM | POA: Diagnosis not present

## 2023-10-21 DIAGNOSIS — F411 Generalized anxiety disorder: Secondary | ICD-10-CM | POA: Diagnosis not present

## 2023-10-21 DIAGNOSIS — Z6833 Body mass index (BMI) 33.0-33.9, adult: Secondary | ICD-10-CM | POA: Insufficient documentation

## 2023-10-21 DIAGNOSIS — Z8673 Personal history of transient ischemic attack (TIA), and cerebral infarction without residual deficits: Secondary | ICD-10-CM | POA: Insufficient documentation

## 2023-10-21 DIAGNOSIS — E669 Obesity, unspecified: Secondary | ICD-10-CM | POA: Insufficient documentation

## 2023-10-21 DIAGNOSIS — E162 Hypoglycemia, unspecified: Principal | ICD-10-CM

## 2023-10-21 DIAGNOSIS — M4722 Other spondylosis with radiculopathy, cervical region: Secondary | ICD-10-CM | POA: Diagnosis present

## 2023-10-21 DIAGNOSIS — D72829 Elevated white blood cell count, unspecified: Secondary | ICD-10-CM | POA: Diagnosis not present

## 2023-10-21 DIAGNOSIS — Z79899 Other long term (current) drug therapy: Secondary | ICD-10-CM | POA: Insufficient documentation

## 2023-10-21 DIAGNOSIS — Z7984 Long term (current) use of oral hypoglycemic drugs: Secondary | ICD-10-CM | POA: Diagnosis not present

## 2023-10-21 DIAGNOSIS — E7849 Other hyperlipidemia: Secondary | ICD-10-CM

## 2023-10-21 DIAGNOSIS — N179 Acute kidney failure, unspecified: Secondary | ICD-10-CM

## 2023-10-21 LAB — CBC
HCT: 38.3 % (ref 36.0–46.0)
Hemoglobin: 12.5 g/dL (ref 12.0–15.0)
MCH: 29.7 pg (ref 26.0–34.0)
MCHC: 32.6 g/dL (ref 30.0–36.0)
MCV: 91 fL (ref 80.0–100.0)
Platelets: 462 10*3/uL — ABNORMAL HIGH (ref 150–400)
RBC: 4.21 MIL/uL (ref 3.87–5.11)
RDW: 14.9 % (ref 11.5–15.5)
WBC: 11.2 10*3/uL — ABNORMAL HIGH (ref 4.0–10.5)
nRBC: 0 % (ref 0.0–0.2)

## 2023-10-21 LAB — COMPREHENSIVE METABOLIC PANEL
ALT: 11 U/L (ref 0–44)
AST: 18 U/L (ref 15–41)
Albumin: 3.7 g/dL (ref 3.5–5.0)
Alkaline Phosphatase: 55 U/L (ref 38–126)
Anion gap: 13 (ref 5–15)
BUN: 14 mg/dL (ref 6–20)
CO2: 22 mmol/L (ref 22–32)
Calcium: 9.5 mg/dL (ref 8.9–10.3)
Chloride: 101 mmol/L (ref 98–111)
Creatinine, Ser: 1.01 mg/dL — ABNORMAL HIGH (ref 0.44–1.00)
GFR, Estimated: >60 mL/min (ref >60)
Glucose, Bld: 50 mg/dL — ABNORMAL LOW (ref 70–99)
Potassium: 4.5 mmol/L (ref 3.5–5.1)
Sodium: 136 mmol/L (ref 135–145)
Total Bilirubin: 0.5 mg/dL (ref 0.0–1.2)
Total Protein: 6.6 g/dL (ref 6.5–8.1)

## 2023-10-21 LAB — CBG MONITORING, ED
Glucose-Capillary: 46 mg/dL — ABNORMAL LOW (ref 70–99)
Glucose-Capillary: 30 mg/dL — CL (ref 70–99)
Glucose-Capillary: 70 mg/dL (ref 70–99)
Glucose-Capillary: 170 mg/dL — ABNORMAL HIGH (ref 70–99)
Glucose-Capillary: 168 mg/dL — ABNORMAL HIGH (ref 70–99)

## 2023-10-21 MED ORDER — SENNOSIDES-DOCUSATE SODIUM 8.6-50 MG PO TABS: 1.0000 | ORAL_TABLET | Freq: Every evening | ORAL | Status: DC | PRN

## 2023-10-21 MED ORDER — PANTOPRAZOLE SODIUM 40 MG PO TBEC
40.0000 mg | DELAYED_RELEASE_TABLET | Freq: Every day | ORAL | Status: DC
  Administered 2023-10-22: 40 mg via ORAL
  Filled 2023-10-21: qty 1

## 2023-10-21 MED ORDER — DULOXETINE HCL 60 MG PO CPEP
60.0000 mg | ORAL_CAPSULE | Freq: Every day | ORAL | Status: DC
  Administered 2023-10-22: 60 mg via ORAL
  Filled 2023-10-21: qty 1

## 2023-10-21 MED ORDER — ASPIRIN 81 MG PO TBEC
81.0000 mg | DELAYED_RELEASE_TABLET | Freq: Every day | ORAL | Status: DC
  Administered 2023-10-22: 81 mg via ORAL
  Filled 2023-10-21: qty 1

## 2023-10-21 MED ORDER — LACTATED RINGERS IV BOLUS
1000.0000 mL | Freq: Once | INTRAVENOUS | Status: AC
  Administered 2023-10-21: 1000 mL via INTRAVENOUS

## 2023-10-21 MED ORDER — ATORVASTATIN CALCIUM 10 MG PO TABS
20.0000 mg | ORAL_TABLET | Freq: Every day | ORAL | Status: DC
  Administered 2023-10-22: 20 mg via ORAL
  Filled 2023-10-21: qty 2

## 2023-10-21 MED ORDER — GLUCOSE 4 G PO CHEW
CHEWABLE_TABLET | ORAL | Status: AC
  Administered 2023-10-21: 16 g via ORAL
  Filled 2023-10-21: qty 1

## 2023-10-21 MED ORDER — ACETAMINOPHEN 325 MG PO TABS
650.0000 mg | ORAL_TABLET | Freq: Once | ORAL | Status: AC
  Administered 2023-10-21: 650 mg via ORAL
  Filled 2023-10-21: qty 2

## 2023-10-21 MED ORDER — DEXTROSE 10 % IV SOLN: INTRAVENOUS | Status: DC

## 2023-10-21 MED ORDER — DEXTROSE 10 % IV BOLUS
250.0000 mL | Freq: Once | INTRAVENOUS | Status: DC
  Administered 2023-10-21: 250 mL via INTRAVENOUS

## 2023-10-21 MED ORDER — ONDANSETRON HCL 4 MG/2ML IJ SOLN
4.0000 mg | Freq: Four times a day (QID) | INTRAMUSCULAR | Status: DC | PRN
  Administered 2023-10-22: 4 mg via INTRAVENOUS
  Filled 2023-10-21: qty 2

## 2023-10-21 MED ORDER — ACETAMINOPHEN 650 MG RE SUPP
650.0000 mg | Freq: Four times a day (QID) | RECTAL | Status: DC | PRN
Start: 2023-10-21 — End: 2023-10-23

## 2023-10-21 MED ORDER — ONDANSETRON HCL 4 MG PO TABS: 4.0000 mg | ORAL_TABLET | Freq: Four times a day (QID) | ORAL | Status: DC | PRN

## 2023-10-21 MED ORDER — ENOXAPARIN SODIUM 40 MG/0.4ML IJ SOSY
40.0000 mg | PREFILLED_SYRINGE | INTRAMUSCULAR | Status: DC
  Administered 2023-10-22: 40 mg via SUBCUTANEOUS
  Filled 2023-10-21: qty 0.4

## 2023-10-21 MED ORDER — GLUCOSE 4 G PO CHEW: 4.0000 | CHEWABLE_TABLET | ORAL | Status: DC | PRN

## 2023-10-21 MED ORDER — ACETAMINOPHEN 325 MG PO TABS
650.0000 mg | ORAL_TABLET | Freq: Four times a day (QID) | ORAL | Status: DC | PRN
  Administered 2023-10-22: 650 mg via ORAL
  Filled 2023-10-21: qty 2

## 2023-10-21 MED ORDER — GLUCOSE 40 % PO GEL: 1.0000 | ORAL | Status: DC | PRN

## 2023-10-21 NOTE — ED Notes (Signed)
The pt is c/o a headache also

## 2023-10-21 NOTE — H&P (Signed)
 History and Physical    Joanne Reed FMW:[id number] DOB: [birth year] DOA: 10/21/2023  PCP: Joanne Reed., MD   Patient coming from: Home   Chief Complaint:  Chief Complaint  Patient presents with   Hypoglycemia   ED TRIAGE note:  Pt reports recurrent hypoglycemia today. Pt states that glucose was in the 20s around lunch time and then was 47 right before coming to ED.       HPI:  Joanne Reed is a 53 y.o. female with medical history significant of GERD with esophageal dysphagia due to Schatzki ring, essential hypertension, non-insulin -dependent DM type II, cervical spondylosis with radiculopathy, generalized anxiety disorder and insomnia presented to emergency department evaluation for hypoglycemia. Patient reported that at home she takes glipizide , pioglitazone  and metformin  however recently has been started on Mounjaro 1 month.  Patient's neighbor is a nurse who found patient slight confused and check the blood glucose at home found to be around low 20s range.  Patient husband at the bedside reported that around 11 AM at home patient became very confused and unable to be coherent.  He gave her 1 spoon of sugar afterward patient ate 1 slice of pizza and half cup orange juice however blood glucose continues to remain in the lower range.  Patient denies any loss of consciousness.  However she reported feeling diaphoretic and lethargic.   Patient reported that at home patient blood glucose was found around 20 and when patient in the ED found to be 47. Vital signs stable in the ED. CMP showing low blood glucose 50, elevated creatinine 1.01 otherwise unremarkable.  CBC showing slight leukocytosis 11.2 and elevated platelet 462.  For persistent hypoglycemia in the ED patient has been started on dextrose  10% infusion 100 cc/h.  Blood glucose has been improved 30 to  70.  In the ED patient is alert oriented x 4.    Hospitalist has been contacted for further management of  hypoglycemia.   Significant labs in the ED: Lab Orders         MRSA Next Gen by PCR, Nasal         Comprehensive metabolic panel         CBC         HIV Antibody (routine testing w rflx)         CBC         Comprehensive metabolic panel         Urinalysis, Routine w reflex microscopic -Urine, Clean Catch         Creatinine, urine, random         Sodium, urine, random         CBG monitoring, ED         CBG monitoring, ED         CBG monitoring, ED         CBG monitoring, ED         CBG monitoring, ED       Review of Systems:  Review of Systems  Constitutional:  Negative for chills, fever, malaise/fatigue and weight loss.  Eyes:  Negative for blurred vision.  Respiratory:  Negative for shortness of breath.   Cardiovascular:  Negative for chest pain and palpitations.  Gastrointestinal:  Negative for abdominal pain and nausea.  Neurological:  Negative for dizziness, speech change, seizures, loss of consciousness, weakness and headaches.  Psychiatric/Behavioral:  The patient is not nervous/anxious.   All other systems reviewed and are negative.   Past Medical History:  Diagnosis Date   Abnormal finding on MRI of brain 01/01/2020   Anxiety    Arthritis    Depression    Diabetes mellitus without complication (HCC)    GERD (gastroesophageal reflux disease)    History of kidney stones    hanging out in the kidney, no problems   Hypercholesteremia    Hyperlipidemia    Migraine    MS (multiple sclerosis) (HCC)    Stroke (HCC)    Thyromegaly 09/20/2013   TIA (transient ischemic attack) 2021   08/24/20   Uncontrolled type 2 diabetes mellitus with diabetic polyneuropathy, without long-term current use of insulin  07/10/2017    Past Surgical History:  Procedure Laterality Date   ABDOMINAL HYSTERECTOMY  2009   [facility]--Dr. Nikki   ANTERIOR CERVICAL DECOMPRESSION/DISCECTOMY FUSION 4 LEVELS N/A 06/01/2023   Procedure: ACDF R65,R54,R43,R32;  Surgeon: Carollee Lani BROCKS, DO;  Location:  [facility] OR;  Service: Neurosurgery;  Laterality: N/A;  3C   CESAREAN SECTION  1987, 1990, 1999   x 3   cholecystectomy  2000   COLONOSCOPY  11/14/2007   Dr.Gupta   COLONOSCOPY     x 2   UPPER GI ENDOSCOPY     WISDOM TOOTH EXTRACTION       reports that she has quit smoking. Her smoking use included cigarettes. She has never used smokeless tobacco. She reports that she does not currently use alcohol. She reports that she does not use drugs.  No Known Allergies  Family History  Problem Relation Age of Onset   Kidney cancer Mother    Skin cancer Mother        unsure of type - not melanoma - place removed from nose.   Esophageal cancer Father    Hypertension Brother    Diabetes Paternal Grandmother    Colon cancer Neg Hx    Colon polyps Neg Hx    Rectal cancer Neg Hx    Stomach cancer Neg Hx     Prior to Admission medications   Medication Sig Start Date End Date Taking? Authorizing Provider  ALPRAZolam  (XANAX ) 1 MG tablet Take 1-2 tablets thirty minutes prior to MRI.  May take one additional tablet before entering scanner, if needed.  MUST HAVE DRIVER. Patient taking differently: Take 1-2 mg by mouth as needed (MRI). Take 1-2 tablets thirty minutes prior to MRI.  May take one additional tablet before entering scanner, if needed.  MUST HAVE DRIVER. 5/73/76   Gayland Lauraine PARAS, NP  aspirin  EC 81 MG tablet Take 1 tablet (81 mg total) by mouth daily. 06/08/23   Tomlinson, Sara Caylin, PA-C  atorvastatin  (LIPITOR) 20 MG tablet Take 20 mg by mouth at bedtime.    [provider]  clonazePAM (KLONOPIN) 0.5 MG disintegrating tablet Take 0.5 mg by mouth 2 (two) times daily.    [provider]  DULoxetine  (CYMBALTA ) 60 MG capsule TAKE ONE CAPSULE BY MOUTH EVERY DAY 07/19/22   Onita Duos, MD  glipiZIDE  (GLUCOTROL  XL) 10 MG 24 hr tablet Take 10 mg by mouth 2 (two) times daily. 10/02/21   [provider]  lisinopril  (ZESTRIL ) 5 MG tablet Take 5 mg by mouth at bedtime. 05/18/23    [provider]  metFORMIN  (GLUCOPHAGE ) 500 MG tablet Take 500-1,000 mg by mouth See admin instructions. Take 1000 mg at breakfast  and dinner and 500 mg at lunch 05/03/23   [provider]  MOUNJARO 15 MG/0.5ML Pen Inject 15 mg into the skin once a week. 09/18/23  [provider]  ondansetron  (ZOFRAN -ODT) 4 MG disintegrating tablet Take 1 tablet (4 mg total) by mouth every 8 (eight) hours as needed for nausea or vomiting. 10/03/23   Onita Duos, MD  pantoprazole  (PROTONIX ) 40 MG tablet TAKE 1 TABLET BY MOUTH EVERY DAY 05/18/23   Charlanne Groom, MD  pioglitazone  (ACTOS ) 45 MG tablet Take 45 mg by mouth at bedtime. 03/23/23   [provider]  SUMAtriptan  (IMITREX ) 50 MG tablet Take 1 tablet (50 mg total) by mouth every 2 (two) hours as needed for migraine. May repeat in 2 hours if headache persists or recurs. 10/03/23   Onita Duos, MD  traZODone  (DESYREL ) 150 MG tablet Take 1 tablet (150 mg total) by mouth at bedtime. 10/03/23   Onita Duos, MD     Physical Exam: Vitals:   10/21/23 2008 10/21/23 2012 10/21/23 2241  BP:  119/77 97/62  Pulse:  98 86  Resp:  18 18  Temp:  97.9 F (36.6 C) 98 F (36.7 C)  TempSrc:  Oral Oral  SpO2:   97%  Weight: 94.3 kg    Height: 5' 3 (1.6 m)      Physical Exam Constitutional:      General: She is not in acute distress.    Appearance: She is ill-appearing.  HENT:     Mouth/Throat:     Mouth: Mucous membranes are dry.  Eyes:     Pupils: Pupils are equal, round, and reactive to light.  Cardiovascular:     Rate and Rhythm: Normal rate and regular rhythm.     Pulses: Normal pulses.     Heart sounds: Normal heart sounds.  Pulmonary:     Effort: Pulmonary effort is normal.     Breath sounds: Normal breath sounds.  Abdominal:     General: Bowel sounds are normal.  Musculoskeletal:     Cervical back: Neck supple.  Neurological:     Mental Status: She is oriented to person, place, and time.  Psychiatric:        Mood  and Affect: Mood normal.        Thought Content: Thought content normal.      Labs on Admission: I have personally reviewed following labs and imaging studies  CBC: Recent Labs  Lab 10/21/23 2028  WBC 11.2*  HGB 12.5  HCT 38.3  MCV 91.0  PLT 462*   Basic Metabolic Panel: Recent Labs  Lab 10/21/23 2028  NA 136  K 4.5  CL 101  CO2 22  GLUCOSE 50*  BUN 14  CREATININE 1.01*  CALCIUM  9.5   GFR: Estimated Creatinine Clearance: 70.4 mL/min (A) (by C-G formula based on SCr of 1.01 mg/dL (H)). Liver Function Tests: Recent Labs  Lab 10/21/23 2028  AST 18  ALT 11  ALKPHOS 55  BILITOT 0.5  PROT 6.6  ALBUMIN 3.7   No results for input(s): LIPASE, AMYLASE in the last 168 hours. No results for input(s): AMMONIA in the last 168 hours. Coagulation Profile: No results for input(s): INR, PROTIME in the last 168 hours. Cardiac Enzymes: No results for input(s): CKTOTAL, CKMB, CKMBINDEX, TROPONINI, TROPONINIHS in the last 168 hours. BNP (last 3 results) No results for input(s): BNP in the last 8760 hours. HbA1C: No results for input(s): HGBA1C in the last 72 hours. CBG: Recent Labs  Lab 10/21/23 2011 10/21/23 2032 10/21/23 2047 10/21/23 2145 10/21/23 2234  GLUCAP 46* 30* 70 170* 168*   Lipid Profile: No results for input(s): CHOL, HDL, LDLCALC, TRIG, CHOLHDL,  LDLDIRECT in the last 72 hours. Thyroid  Function Tests: No results for input(s): TSH, T4TOTAL, FREET4, T3FREE, THYROIDAB in the last 72 hours. Anemia Panel: No results for input(s): VITAMINB12, FOLATE, FERRITIN, TIBC, IRON, RETICCTPCT in the last 72 hours. Urine analysis:    Component Value Date/Time   COLORURINE YELLOW (A) 07/10/2021 1943   APPEARANCEUR CLEAR (A) 07/10/2021 1943   LABSPEC 1.010 07/10/2021 1943   PHURINE 6.0 07/10/2021 1943   GLUCOSEU >=1000 07/10/2021 1943   HGBUR NEGATIVE 07/10/2021 1943   BILIRUBINUR NEGATIVE 07/10/2021 1943    BILIRUBINUR - 09/26/2014 1641   KETONESUR TRACE (A) 07/10/2021 1943   PROTEINUR NEGATIVE 07/10/2021 1943   UROBILINOGEN negative 09/26/2014 1641   UROBILINOGEN 0.2 04/04/2008 0800   NITRITE NEGATIVE 07/10/2021 1943   LEUKOCYTESUR NEGATIVE 07/10/2021 1943    Radiological Exams on Admission: I have personally reviewed images No results found.   EKG: Pending EKG.    Assessment/Plan: Principal Problem:   Hypoglycemia Active Problems:   Non-insulin  dependent type 2 diabetes mellitus (HCC)   AKI (acute kidney injury) (HCC)   GAD (generalized anxiety disorder)   GERD (gastroesophageal reflux disease)   Cervical spondylosis with radiculopathy   Essential hypertension   Leukocytosis   Acute metabolic encephalopathy    Assessment and Plan: Hypoglycemia-secondary to polypharmacy Non-insulin -dependent DM type II > Patient presenting from home with confusion and found to have low blood glucose around upper 20s range.  In the ED blood glucose was around 30s range. -CMP showed blood glucose 50 and elevated creatinine 1.01.  CBC showing leukocytosis 11.2. - Patient reported taking metformin  1000 mg twice daily, glipizide  10 mg twice daily and patient to Mounjaro 15 mg weekly (took today). -Persistent hypoglycemia in the setting of polypharmacy.  Hold all oral diabetic regimen. - In the ED dextrose  10% infusion has been started.  Initial rate was 100 mL/h however blood glucose trend up to 176 so decrease the dextrose  10% to 30 cc/h and continue to check POC blood glucose every 2 hours.  Based on the blood glucose trend will discontinue dextrose  10 however given patient has AKI at some point need to change dextrose  to LR. - Chart review A1c 6.3 in 05/24/2023. -Probably on discharge need to stop Mounjaro as well as glipizide .  While he metformin  would be judicial.  Patient needs to establish care with endocrinology outpatient for management of DM type II.   Acute metabolic encephalopathy-2nd to  hypoglycemia-resolved -Patient's family member at bedside reported that patient was confused, diaphoretic and incoherent in the setting of low blood glucose.  Patient did not loss consciousness. - Confusion/acute metabolic encephalopathy secondary to hypoglycemia which has been resolved. - Continue fall precaution and neurocheck every 4 hours.  Prerenal acute kidney injury -CMP showed creatinine 1.01.  Baseline normal renal function.  At presentation to ED patient found hypotensive. - Prerenal acute kidney injury in the setting of hypotension. -Giving 1 L of LR bolus.  Currently on D10 infusion 30 cc/h for the management of hypoglycemia. -Checking UA, urine sodium and creatinine. - Continue to monitor improvement of renal function  Leukocytosis -Reactive leukocytosis in the setting of acute illness continue to monitor.  Generalized anxiety disorder -Continue Cymbalta  60 mg daily  History of esophageal dysmotility due to Schatzki ring - Continue aspiration precaution.  GERD -Continue Protonix  40 mg daily  Essential hypertension -Holding lisinopril  in the setting of hypotension and AKI.  Will see  once appropriate   Hyperlipidemia - Continue Lipitor 20 mg daily  DVT prophylaxis:  Lovenox  Code Status:  Full Code Diet: Heart healthy and carb modified Family Communication:   Family was present at bedside, at the time of interview. Opportunity was given to ask question and all questions were answered satisfactorily.  Disposition Plan: Pending improvement of blood glucose level and renal function.  Tentative  discharge to home next 1 to 2 days Consults:  none Admission status:   Observation, progressive unit  Severity of Illness: The appropriate patient status for this patient is OBSERVATION. Observation status is judged to be reasonable and necessary in order to provide the required intensity of service to ensure the patient's safety. The patient's presenting symptoms, physical exam  findings, and initial radiographic and laboratory data in the context of their medical condition is felt to place them at decreased risk for further clinical deterioration. Furthermore, it is anticipated that the patient will be medically stable for discharge from the hospital within 2 midnights of admission.     Gabryelle Whitmoyer, MD [facility]  How to contact the [facility] Attending or Consulting provider 7A - 7P or covering provider during after hours 7P -7A, for this patient.  Check the care team in [facility] and look for a) attending/consulting [facility] provider listed and b) the [facility] team listed Log into [email or web address] and use 's universal password to access. If you do not have the password, please contact the hospital operator. Locate the [facility] provider you are looking for under [facility] and page to a number that you can be directly reached. If you still have difficulty reaching the provider, please page the [facility] (Director on Call) for the Hospitalists listed on amion for assistance.  10/21/2023, 11:38 PM

## 2023-10-21 NOTE — ED Notes (Signed)
 The pt dif not receive the 250cc of d 28 w originally ordered the doctor changed the order  to per hour

## 2023-10-21 NOTE — ED Notes (Addendum)
 Pt given juice and snacks to try to bring up glucose.Charge RN made aware of need for room.

## 2023-10-21 NOTE — ED Notes (Signed)
 The pt arrived  by pov when she was brought to the room she was alert and oriented skin cool and  dry her husband at  the bedside reports the pts blood sugar low since 1100am today and has been  in the 40s all day long

## 2023-10-21 NOTE — ED Notes (Signed)
 Dr Doran Durand has ploaced an order for d10w to be reduced to 14ml/hr

## 2023-10-21 NOTE — Discharge Instructions (Addendum)
 Joanne Reed,  You were in the hospital because of hypoglycemia. This appears to be secondary to a combination of your diabetes medications. I will recommend that you stop using your glipizide  and Mounjaro. You can continue your metformin . Please follow-up with your primary care physician.

## 2023-10-21 NOTE — ED Notes (Signed)
 Report called to 2c  rn accepted the pot

## 2023-10-21 NOTE — ED Triage Notes (Signed)
 Pt reports recurrent hypoglycemia today. Pt states that glucose was in the 20s around lunch time and then was 47 right before coming to ED.

## 2023-10-21 NOTE — ED Provider Notes (Signed)
 [place] EMERGENCY DEPARTMENT AT [facility] Provider Note   CSN: [id number] Arrival date & time: 10/21/23  1956     History Chief Complaint  Patient presents with   Hypoglycemia    HPI Joanne Reed is a 53 y.o. female presenting for hypoglycemia.  Brought in by family for glucose of 10 at home.  Takes multiple antihyperglycemics had Mounjaro added on. States that she was nearly unarousable 4 hours ago but a family member who is a nurse brought sugar and for sugar in her mouth and she improved over the following 30 minutes.   Patient's recorded medical, surgical, social, medication list and allergies were reviewed in the Snapshot window as part of the initial history.   Review of Systems   Review of Systems  Constitutional:  Negative for chills and fever.  HENT:  Negative for ear pain and sore throat.   Eyes:  Negative for pain and visual disturbance.  Respiratory:  Negative for cough and shortness of breath.   Cardiovascular:  Negative for chest pain and palpitations.  Gastrointestinal:  Negative for abdominal pain and vomiting.  Genitourinary:  Negative for dysuria and hematuria.  Musculoskeletal:  Negative for arthralgias and back pain.  Skin:  Negative for color change and rash.  Neurological:  Negative for seizures and syncope.  All other systems reviewed and are negative.   Physical Exam Updated Vital Signs BP 97/62 (BP Location: Left Arm)   Pulse 86   Temp 98 F (36.7 C) (Oral)   Resp 18   Ht 5' 2 (1.575 m)   Wt 94.3 kg   SpO2 97%   BMI 38.04 kg/m  Physical Exam Constitutional:      General: She is not in acute distress.    Appearance: She is not ill-appearing or toxic-appearing.  HENT:     Head: Normocephalic and atraumatic.  Eyes:     Extraocular Movements: Extraocular movements intact.     Pupils: Pupils are equal, round, and reactive to light.  Cardiovascular:     Rate and Rhythm: Normal rate.  Pulmonary:     Effort: No respiratory  distress.  Abdominal:     General: Abdomen is flat.  Musculoskeletal:        General: No swelling, deformity or signs of injury.     Cervical back: Normal range of motion. No rigidity.  Skin:    General: Skin is warm and dry.  Neurological:     General: No focal deficit present.     Mental Status: She is alert and oriented to person, place, and time.  Psychiatric:        Mood and Affect: Mood normal.      ED Course/ Medical Decision Making/ A&P    Procedures .Critical Care  Performed by: Jerral Meth, MD Authorized by: Jerral Meth, MD   Critical care provider statement:    Critical care time (minutes):  30   Critical care was necessary to treat or prevent imminent or life-threatening deterioration of the following conditions:  Metabolic crisis   Critical care was time spent personally by me on the following activities:  Development of treatment plan with patient or surrogate, discussions with consultants, evaluation of patient's response to treatment, examination of patient, ordering and review of laboratory studies, ordering and review of radiographic studies, ordering and performing treatments and interventions, pulse oximetry, re-evaluation of patient's condition and review of old charts   Care discussed with: admitting provider      Medications Ordered in  ED Medications  glucose chewable tablet 16 g (16 g Oral Given 10/21/23 2037)  aspirin  EC tablet 81 mg (has no administration in time range)  atorvastatin  (LIPITOR) tablet 20 mg (20 mg Oral Given 10/22/23 0001)  DULoxetine  (CYMBALTA ) DR capsule 60 mg (has no administration in time range)  enoxaparin  (LOVENOX ) injection 40 mg (has no administration in time range)  acetaminophen  (TYLENOL ) tablet 650 mg (has no administration in time range)    Or  acetaminophen  (TYLENOL ) suppository 650 mg (has no administration in time range)  senna-docusate (Senokot-S) tablet 1 tablet (has no administration in time range)   ondansetron  (ZOFRAN ) tablet 4 mg (has no administration in time range)    Or  ondansetron  (ZOFRAN ) injection 4 mg (has no administration in time range)  dextrose  10 % infusion ( Intravenous New Bag/Given 10/21/23 2240)  pantoprazole  (PROTONIX ) EC tablet 40 mg (has no administration in time range)  acetaminophen  (TYLENOL ) tablet 650 mg (650 mg Oral Given 10/21/23 2117)  lactated ringers  bolus 1,000 mL (1,000 mLs Intravenous New Bag/Given 10/21/23 2356)    Medical Decision Making:   I was called emergently to patient's bedside.  Her glucose was 30.  Given D50 amp.  Improved to 70.  Started on a D10 drip.  As patient has been trying to treat her hypoglycemia for 9 hours with supportive techniques, will continue on a D10 drip and admit to medicine for further care and management.  Disposition:   Based on the above findings, I believe this patient is stable for admission.    Patient/family educated about specific findings on our evaluation and explained exact reasons for admission.  Patient/family educated about clinical situation and time was allowed to answer questions.   Admission team communicated with and agreed with need for admission. Patient admitted. Patient  ready to move at this time.     Emergency Department Medication Summary:   Medications  glucose chewable tablet 16 g (16 g Oral Given 10/21/23 2037)  aspirin  EC tablet 81 mg (has no administration in time range)  atorvastatin  (LIPITOR) tablet 20 mg (20 mg Oral Given 10/22/23 0001)  DULoxetine  (CYMBALTA ) DR capsule 60 mg (has no administration in time range)  enoxaparin  (LOVENOX ) injection 40 mg (has no administration in time range)  acetaminophen  (TYLENOL ) tablet 650 mg (has no administration in time range)    Or  acetaminophen  (TYLENOL ) suppository 650 mg (has no administration in time range)  senna-docusate (Senokot-S) tablet 1 tablet (has no administration in time range)  ondansetron  (ZOFRAN ) tablet 4 mg (has no administration in  time range)    Or  ondansetron  (ZOFRAN ) injection 4 mg (has no administration in time range)  dextrose  10 % infusion ( Intravenous New Bag/Given 10/21/23 2240)  pantoprazole  (PROTONIX ) EC tablet 40 mg (has no administration in time range)  acetaminophen  (TYLENOL ) tablet 650 mg (650 mg Oral Given 10/21/23 2117)  lactated ringers  bolus 1,000 mL (1,000 mLs Intravenous New Bag/Given 10/21/23 2356)        Clinical Impression:  1. Hypoglycemia      Admit   Final Clinical Impression(s) / ED Diagnoses Final diagnoses:  Hypoglycemia    Rx / DC Orders ED Discharge Orders     None         Jerral Meth, MD 10/22/23 0006

## 2023-10-22 DIAGNOSIS — E162 Hypoglycemia, unspecified: Secondary | ICD-10-CM

## 2023-10-22 LAB — GLUCOSE, CAPILLARY
Glucose-Capillary: 132 mg/dL — ABNORMAL HIGH (ref 70–99)
Glucose-Capillary: 89 mg/dL (ref 70–99)
Glucose-Capillary: 97 mg/dL (ref 70–99)
Glucose-Capillary: 105 mg/dL — ABNORMAL HIGH (ref 70–99)
Glucose-Capillary: 150 mg/dL — ABNORMAL HIGH (ref 70–99)
Glucose-Capillary: 107 mg/dL — ABNORMAL HIGH (ref 70–99)
Glucose-Capillary: 105 mg/dL — ABNORMAL HIGH (ref 70–99)
Glucose-Capillary: 102 mg/dL — ABNORMAL HIGH (ref 70–99)
Glucose-Capillary: 95 mg/dL (ref 70–99)

## 2023-10-22 LAB — CBC
HCT: 32.5 % — ABNORMAL LOW (ref 36.0–46.0)
Hemoglobin: 11 g/dL — ABNORMAL LOW (ref 12.0–15.0)
MCH: 30.4 pg (ref 26.0–34.0)
MCHC: 33.8 g/dL (ref 30.0–36.0)
MCV: 89.8 fL (ref 80.0–100.0)
Platelets: 351 10*3/uL (ref 150–400)
RBC: 3.62 MIL/uL — ABNORMAL LOW (ref 3.87–5.11)
RDW: 14.9 % (ref 11.5–15.5)
WBC: 7.3 10*3/uL (ref 4.0–10.5)
nRBC: 0 % (ref 0.0–0.2)

## 2023-10-22 LAB — COMPREHENSIVE METABOLIC PANEL
ALT: 10 U/L (ref 0–44)
AST: 13 U/L — ABNORMAL LOW (ref 15–41)
Albumin: 3 g/dL — ABNORMAL LOW (ref 3.5–5.0)
Alkaline Phosphatase: 49 U/L (ref 38–126)
Anion gap: 10 (ref 5–15)
BUN: 12 mg/dL (ref 6–20)
CO2: 25 mmol/L (ref 22–32)
Calcium: 9 mg/dL (ref 8.9–10.3)
Chloride: 102 mmol/L (ref 98–111)
Creatinine, Ser: 0.9 mg/dL (ref 0.44–1.00)
GFR, Estimated: >60 mL/min (ref >60)
Glucose, Bld: 117 mg/dL — ABNORMAL HIGH (ref 70–99)
Potassium: 4.6 mmol/L (ref 3.5–5.1)
Sodium: 137 mmol/L (ref 135–145)
Total Bilirubin: 0.5 mg/dL (ref 0.0–1.2)
Total Protein: 5.5 g/dL — ABNORMAL LOW (ref 6.5–8.1)

## 2023-10-22 LAB — MRSA NEXT GEN BY PCR, NASAL: MRSA by PCR Next Gen: NOT DETECTED

## 2023-10-22 LAB — HIV ANTIBODY (ROUTINE TESTING W REFLEX): HIV Screen 4th Generation wRfx: NONREACTIVE

## 2023-10-22 NOTE — Discharge Summary (Addendum)
 Physician Discharge Summary   Patient: Joanne Reed MRN: [id number] DOB: 1971/02/14  Admit date:     10/21/2023  Discharge date: 10/22/23  Discharge Physician: Joanne Lam, MD   PCP: Joanne Norleen PARAS., MD   Recommendations at discharge:  PCP visit for hospital follow-up  Discharge Diagnoses: Principal Problem:   Hypoglycemia Active Problems:   Non-insulin  dependent type 2 diabetes mellitus (HCC)   AKI (acute kidney injury) (HCC)   GAD (generalized anxiety disorder)   GERD (gastroesophageal reflux disease)   Cervical spondylosis with radiculopathy   Essential hypertension   Leukocytosis   Acute metabolic encephalopathy  Resolved Problems:   * No resolved hospital problems. *  Hospital Course: Joanne Reed is a 53 y.o. female with a history of GERD, esophogeal dysphagia, diabetes mellitus, cervical spondylosis with radiculopathy, generalized anxiety disorder, insomnia.  Patient presented secondary to profound hypoglycemia with blood sugar in the 20s secondary to combination of glipizide  and Mounjaro. Patient managed on D10 IV fluids with stability of blood sugar. She maintained blood sugar above 90 prior to discharge off of IV fluids. Mounjaro and glipizide  discontinued.  Assessment and Plan:  Hypoglycemia Iatrogenic secondary to combination of Mounjaro and glipizide . Blood sugar as low as 20s with blood sugar in 30 range while in ED. Patient started on D10 IV fluids with stability of blood sugar. D10 discontinued. Blood sugar maintained above 90 prior to discharge. Recommendation for patient to discontinue glipizide  and Mounjaro on discharge. Follow-up with PCP.  Diabetes mellitus type 2 Poorly controlled with hypoglycemia. Hemoglobin A1C of 6.3%. Patient is on metformin , glipizide  and Mounjaro as an outpatient. Glipizide  and Mounjaro discontinued on discharge.  AKI Baseline creatinine of 0.7. Creatinine of 101 on admission. Improved to 0.9 prior to  discharge.  Leukocytosis Reactive. Resolved.  Generalized anxiety disorder Continue Cymbalta   GERD Continue Protonix   Primary hypertension Resume home lisinopril   Hyperlipidemia Continue Lipitor and Actos .  Obesity, class I Estimated body mass index is 33.23 kg/m as calculated from the following:   Height as of this encounter: 5' 2 (1.575 m).   Weight as of this encounter: 82.4 kg.    Consultants: None Procedures performed: None  Disposition: Home Diet recommendation: Carb modified diet   DISCHARGE MEDICATION: Allergies as of 10/22/2023   No Known Allergies      Medication List     STOP taking these medications    ALPRAZolam  1 MG tablet Commonly known as: XANAX    glipiZIDE  10 MG 24 hr tablet Commonly known as: GLUCOTROL  XL   Mounjaro 15 MG/0.5ML Pen Generic drug: tirzepatide       TAKE these medications    aspirin  EC 81 MG tablet Take 1 tablet (81 mg total) by mouth daily.   atorvastatin  20 MG tablet Commonly known as: LIPITOR Take 20 mg by mouth at bedtime.   clonazePAM 0.5 MG disintegrating tablet Commonly known as: KLONOPIN Take 0.5 mg by mouth 2 (two) times daily.   DULoxetine  60 MG capsule Commonly known as: CYMBALTA  TAKE ONE CAPSULE BY MOUTH EVERY DAY   lisinopril  5 MG tablet Commonly known as: ZESTRIL  Take 5 mg by mouth at bedtime.   metFORMIN  500 MG tablet Commonly known as: GLUCOPHAGE  Take 500-1,000 mg by mouth See admin instructions. Take 1000 mg at breakfast  and dinner and 500 mg at lunch   ondansetron  4 MG disintegrating tablet Commonly known as: ZOFRAN -ODT Take 1 tablet (4 mg total) by mouth every 8 (eight) hours as needed for nausea or vomiting.  pantoprazole  40 MG tablet Commonly known as: PROTONIX  TAKE 1 TABLET BY MOUTH EVERY DAY   pioglitazone  45 MG tablet Commonly known as: ACTOS  Take 45 mg by mouth at bedtime.   SUMAtriptan  50 MG tablet Commonly known as: Imitrex  Take 1 tablet (50 mg total) by mouth every  2 (two) hours as needed for migraine. May repeat in 2 hours if headache persists or recurs.   traZODone  150 MG tablet Commonly known as: DESYREL  Take 1 tablet (150 mg total) by mouth at bedtime.        Follow-up Information     Slatosky, Norleen PARAS., MD. Schedule an appointment as soon as possible for a visit.   Specialty: Family Medicine Why: For hospital follow-up Contact information: [address] [place] [address] [phone]                Discharge Exam: BP (!) 104/57 (BP Location: Left Arm)   Pulse 82   Temp 98.6 F (37 C) (Oral)   Resp 11   Ht 5' 2 (1.575 m)   Wt 82.4 kg   SpO2 97%   BMI 33.23 kg/m   General exam: Appears calm and comfortable Respiratory system: Clear to auscultation. Respiratory effort normal. Cardiovascular system: S1 & S2 heard, RRR. No murmurs, rubs, gallops or clicks. Gastrointestinal system: Abdomen is nondistended, soft and nontender. No organomegaly or masses felt. Normal bowel sounds heard. Central nervous system: Alert and oriented. No focal neurological deficits. Musculoskeletal: No edema. No calf tenderness Skin: No cyanosis. No rashes Psychiatry: Judgement and insight appear normal. Mood & affect appropriate.   Condition at discharge: stable  The results of significant diagnostics from this hospitalization (including imaging, microbiology, ancillary and laboratory) are listed below for reference.   Imaging Studies: No results found.  Microbiology: Results for orders placed or performed during the hospital encounter of 10/21/23  MRSA Next Gen by PCR, Nasal     Status: None   Collection Time: 10/21/23 11:55 PM   Specimen: Nasal Mucosa; Nasal Swab  Result Value Ref Range Status   MRSA by PCR Next Gen NOT DETECTED NOT DETECTED Final    Comment: (NOTE) The GeneXpert MRSA Assay (FDA approved for NASAL specimens only), is one component of a comprehensive MRSA colonization surveillance program. It is not intended to  diagnose MRSA infection nor to guide or monitor treatment for MRSA infections. Test performance is not FDA approved in patients less than 65 years old. Performed at [facility], [address], [place], [place] [address]     Labs: CBC: Recent Labs  Lab 10/21/23 2028 10/22/23 0216  WBC 11.2* 7.3  HGB 12.5 11.0*  HCT 38.3 32.5*  MCV 91.0 89.8  PLT 462* 351   Basic Metabolic Panel: Recent Labs  Lab 10/21/23 2028 10/22/23 0216  NA 136 137  K 4.5 4.6  CL 101 102  CO2 22 25  GLUCOSE 50* 117*  BUN 14 12  CREATININE 1.01* 0.90  CALCIUM  9.5 9.0   Liver Function Tests: Recent Labs  Lab 10/21/23 2028 10/22/23 0216  AST 18 13*  ALT 11 10  ALKPHOS 55 49  BILITOT 0.5 0.5  PROT 6.6 5.5*  ALBUMIN 3.7 3.0*   CBG: Recent Labs  Lab 10/22/23 0848 10/22/23 1115 10/22/23 1403 10/22/23 1605 10/22/23 1753  GLUCAP 150* 107* 105* 102* 95    Discharge time spent: 35 minutes.  Signed: Elgin Lam, MD [facility] 10/22/2023

## 2023-10-22 NOTE — Hospital Course (Addendum)
 Joanne Reed is a 53 y.o. female with a history of GERD, esophogeal dysphagia, diabetes mellitus, cervical spondylosis with radiculopathy, generalized anxiety disorder, insomnia.  Patient presented secondary to profound hypoglycemia with blood sugar in the 20s secondary to combination of glipizide  and Mounjaro. Patient managed on D10 IV fluids with stability of blood sugar. She maintained blood sugar above 90 prior to discharge off of IV fluids. Mounjaro and glipizide  discontinued.

## 2023-10-22 NOTE — Plan of Care (Signed)
  Problem: Health Behavior/Discharge Planning: Goal: Ability to manage health-related needs will improve Outcome: Progressing   Problem: Clinical Measurements: Goal: Ability to maintain clinical measurements within normal limits will improve Outcome: Progressing   Problem: Clinical Measurements: Goal: Will remain free from infection Outcome: Progressing   Problem: Clinical Measurements: Goal: Diagnostic test results will improve Outcome: Progressing   Problem: Elimination: Goal: Will not experience complications related to bowel motility Outcome: Progressing

## 2023-10-22 NOTE — Progress Notes (Signed)
 Pt discharged to home. Peripheral IV taken out. Tip intact. Gauze and tape applied at site. Taken to front of hospital in w/c where ride was awaiting.

## 2023-10-22 NOTE — Plan of Care (Signed)
  Problem: Health Behavior/Discharge Planning: Goal: Ability to manage health-related needs will improve 10/22/2023 1938 by Rufino Sinclair HERO, RN Outcome: Adequate for Discharge 10/22/2023 1133 by Rufino Sinclair HERO, RN Outcome: Progressing   Problem: Clinical Measurements: Goal: Ability to maintain clinical measurements within normal limits will improve 10/22/2023 1938 by Rufino Sinclair HERO, RN Outcome: Adequate for Discharge 10/22/2023 1133 by Rufino Sinclair HERO, RN Outcome: Progressing   Problem: Clinical Measurements: Goal: Will remain free from infection 10/22/2023 1938 by Rufino Sinclair HERO, RN Outcome: Adequate for Discharge 10/22/2023 1133 by Rufino Sinclair HERO, RN Outcome: Progressing   Problem: Clinical Measurements: Goal: Diagnostic test results will improve 10/22/2023 1938 by Rufino Sinclair HERO, RN Outcome: Adequate for Discharge 10/22/2023 1133 by Rufino Sinclair HERO, RN Outcome: Progressing   Problem: Clinical Measurements: Goal: Respiratory complications will improve 10/22/2023 1938 by Rufino Sinclair HERO, RN Outcome: Adequate for Discharge 10/22/2023 1133 by Rufino Sinclair HERO, RN Outcome: Progressing

## 2023-10-22 NOTE — Plan of Care (Signed)
   Problem: Education: Goal: Knowledge of General Education information will improve Description: Including pain rating scale, medication(s)/side effects and non-pharmacologic comfort measures Outcome: Progressing   Problem: Clinical Measurements: Goal: Ability to maintain clinical measurements within normal limits will improve Outcome: Progressing Goal: Will remain free from infection Outcome: Progressing

## 2023-10-23 LAB — HEMOGLOBIN A1C
Hgb A1c MFr Bld: 5.3 % (ref 4.8–5.6)
Mean Plasma Glucose: 105 mg/dL

## 2023-10-31 ENCOUNTER — Ambulatory Visit (INDEPENDENT_AMBULATORY_CARE_PROVIDER_SITE_OTHER): Payer: BC Managed Care – PPO

## 2023-10-31 DIAGNOSIS — M47812 Spondylosis without myelopathy or radiculopathy, cervical region: Secondary | ICD-10-CM

## 2023-10-31 DIAGNOSIS — R9089 Other abnormal findings on diagnostic imaging of central nervous system: Secondary | ICD-10-CM | POA: Diagnosis not present

## 2023-10-31 DIAGNOSIS — G43709 Chronic migraine without aura, not intractable, without status migrainosus: Secondary | ICD-10-CM | POA: Diagnosis not present

## 2023-10-31 MED ORDER — GADOBENATE DIMEGLUMINE 529 MG/ML IV SOLN
17.0000 mL | Freq: Once | INTRAVENOUS | Status: AC | PRN
  Administered 2023-10-31: 17 mL via INTRAVENOUS

## 2023-11-02 ENCOUNTER — Other Ambulatory Visit (HOSPITAL_COMMUNITY): Payer: Self-pay

## 2023-11-05 ENCOUNTER — Other Ambulatory Visit: Payer: Self-pay | Admitting: Gastroenterology

## 2023-11-07 ENCOUNTER — Encounter: Payer: Self-pay | Admitting: Neurology

## 2024-02-03 ENCOUNTER — Other Ambulatory Visit: Payer: Self-pay | Admitting: Gastroenterology

## 2024-03-05 ENCOUNTER — Other Ambulatory Visit: Payer: Self-pay | Admitting: Physician Assistant

## 2024-03-05 ENCOUNTER — Encounter: Payer: Self-pay | Admitting: Physician Assistant

## 2024-03-05 ENCOUNTER — Ambulatory Visit (INDEPENDENT_AMBULATORY_CARE_PROVIDER_SITE_OTHER): Admitting: Physician Assistant

## 2024-03-05 VITALS — BP 108/64 | HR 80 | Ht 62.0 in | Wt 167.0 lb

## 2024-03-05 DIAGNOSIS — R1310 Dysphagia, unspecified: Secondary | ICD-10-CM | POA: Diagnosis not present

## 2024-03-05 DIAGNOSIS — E119 Type 2 diabetes mellitus without complications: Secondary | ICD-10-CM | POA: Diagnosis not present

## 2024-03-05 DIAGNOSIS — R1319 Other dysphagia: Secondary | ICD-10-CM

## 2024-03-05 DIAGNOSIS — G35 Multiple sclerosis: Secondary | ICD-10-CM | POA: Diagnosis not present

## 2024-03-05 DIAGNOSIS — Z7985 Long-term (current) use of injectable non-insulin antidiabetic drugs: Secondary | ICD-10-CM

## 2024-03-05 DIAGNOSIS — K219 Gastro-esophageal reflux disease without esophagitis: Secondary | ICD-10-CM | POA: Diagnosis not present

## 2024-03-05 DIAGNOSIS — Z860101 Personal history of adenomatous and serrated colon polyps: Secondary | ICD-10-CM

## 2024-03-05 MED ORDER — PANTOPRAZOLE SODIUM 40 MG PO TBEC: 40.0000 mg | DELAYED_RELEASE_TABLET | Freq: Every day | ORAL | 3 refills | Status: AC

## 2024-03-05 MED ORDER — FAMOTIDINE 40 MG PO TABS: 40.0000 mg | ORAL_TABLET | Freq: Every day | ORAL | 0 refills | Status: DC

## 2024-03-05 NOTE — Progress Notes (Signed)
 03/05/2024 Joanne Reed [id number] [birth year]  Referring provider: Lucius Sabins., MD Primary GI doctor: Dr. Venice Gillis  ASSESSMENT AND PLAN:  GERD with mild stricture s/p dilation 2023 did help swallowing Barium swallow normal 02/08/2022 Jun 01 2023 s/p ACDEF C3-4, C4-5 C5-6  dysphagia to solids right before surgery  worse since surgery, getting progressive, never liquids, rare aspiration 03/09/2022 EGD mildly torturous esophagus nonobstructing mild Schatzki ring status post dilation, small hiatal hernia normal duodenum, pathology negative for H. pylori negative celiac, esophageal biopsies consistent with reflux negative for EOE On pantoprazole  40 mg once daily, GERD well controlled No NSAIDS, rare ETOH, no melena - will schedule for barium swallow to evaluate swallowing, may be from recent surgery, MS, stricture, dysmotility from mounjaro - pending results can set up for EGD with dilation I discussed risks of EGD with patient today, including risk of sedation, bleeding or perforation.  Patient provides understanding and gave verbal consent to proceed. -In the interim patient advised about swallowing precautions.  -Eat slowly, chew food well before swallowing.  -Drink liquids in between each bite to avoid food impaction. - continue pantoprazole  40 mg, add on pepcid x 30 days  Personal history of adenomatous polyps Recall colonoscopy 10/25/2024 She is not on blood thinner, no oxygen, she is on mounjaro Will set up for previsit for colonoscopy  MS Follows with Dr. Gracie Lav Not on treatment at this time Recent MRI Brain  Diabetes On mounjaro Discussed GLP1 with the patient, mechanism of action. Gastroparesis diet given to the patient.  Patient should be instructed to hold this medications if dose falls within 7 days of endoscopic procedure, due to increased risk of retained gastric contents.   Patient Care Team: Lucius Sabins., MD as PCP - General (Family Medicine) Robbin Chill,  Kaylene Pascal., MD (Family Medicine) Sandee Crook Margart Shears, MD as Consulting Physician (Cardiology)  HISTORY OF PRESENT ILLNESS: 53 y.o. female with a past medical history listed below presents for medication refill.  Last seen May 2023 for GERD and had EGD with Dr. Venice Gillis that was benign  Discussed the use of AI scribe software for clinical note transcription with the patient, who gave verbal consent to proceed.  History of Present Illness   Joanne Reed is a 53 year old female with esophageal issues who presents with worsening dysphagia.  She experiences difficulty swallowing, primarily with solid foods, describing a sensation of food getting 'stuck' in her throat. This issue has progressively worsened and now occurs almost daily, though liquids are not affected. Significant improvement was noted with pantoprazole  40 mg once daily, but symptoms worsened following neck surgery on January 30, 2023. She associates some swallowing difficulties with her multiple sclerosis diagnosis.  Her last endoscopy in May 2023 showed a tortuous esophagus with a mild ring, which was dilated at that time. Initial improvement in swallowing was noted post-dilation, but symptoms worsened after neck surgery. A barium swallow on February 09, 2023, was unremarkable, but a subsequent endoscopy revealed a mild Schatzki ring, not detected in the barium swallow.  No chest pain, shortness of breath, or abdominal discomfort. Heartburn is well controlled with pantoprazole , and regurgitation is infrequent. She occasionally consumes alcohol and denies regular use of NSAIDs or other medications that could exacerbate her symptoms.      She  reports that she has quit smoking. Her smoking use included cigarettes. She has never used smokeless tobacco. She reports that she does not currently use alcohol. She reports that she does not  use drugs.  RELEVANT GI HISTORY, IMAGING AND LABS: Results   RADIOLOGY Barium swallow: Unremarkable, no narrowing  (02/09/2023)  DIAGNOSTIC Endoscopy: Mild Schatzki ring, dilation performed (02/2023)     Colonoscopy 10/25/2021 - Three 4 to 6 mm polyps in the proximal sigmoid colon and in the mid descending colon, removed with a cold snare. Resected and retrieved. Bx- TAs - Non-bleeding internal hemorrhoids. - The examination was otherwise normal on direct and retroflexion views. - Rpt 3 yrs.  CBC    Component Value Date/Time   WBC 7.3 10/22/2023 0216   RBC 3.62 (L) 10/22/2023 0216   HGB 11.0 (L) 10/22/2023 0216   HCT 32.5 (L) 10/22/2023 0216   PLT 351 10/22/2023 0216   MCV 89.8 10/22/2023 0216   MCV 90.4 03/12/2012 1152   MCH 30.4 10/22/2023 0216   MCHC 33.8 10/22/2023 0216   RDW 14.9 10/22/2023 0216   Recent Labs    05/24/23 1453 10/21/23 2028 10/22/23 0216  HGB 12.8 12.5 11.0*    CMP     Component Value Date/Time   NA 137 10/22/2023 0216   NA 140 02/09/2022 0922   K 4.6 10/22/2023 0216   CL 102 10/22/2023 0216   CO2 25 10/22/2023 0216   GLUCOSE 117 (H) 10/22/2023 0216   BUN 12 10/22/2023 0216   BUN 7 02/09/2022 0922   CREATININE 0.90 10/22/2023 0216   CALCIUM  9.0 10/22/2023 0216   PROT 5.5 (L) 10/22/2023 0216   PROT 6.2 02/09/2022 0922   ALBUMIN 3.0 (L) 10/22/2023 0216   ALBUMIN 4.0 02/09/2022 0922   ALBUMIN 3.2 (L) 01/09/2020 0844   AST 13 (L) 10/22/2023 0216   ALT 10 10/22/2023 0216   ALKPHOS 49 10/22/2023 0216   BILITOT 0.5 10/22/2023 0216   BILITOT 0.4 02/09/2022 0922   GFRNONAA >60 10/22/2023 0216   GFRAA >60 07/03/2020 1915      Latest Ref Rng & Units 10/22/2023    2:16 AM 10/21/2023    8:28 PM 05/24/2023    2:53 PM  Hepatic Function  Total Protein 6.5 - 8.1 g/dL 5.5  6.6  6.5   Albumin 3.5 - 5.0 g/dL 3.0  3.7  3.7   AST 15 - 41 U/L 13  18  17    ALT 0 - 44 U/L 10  11  17    Alk Phosphatase 38 - 126 U/L 49  55  55   Total Bilirubin 0.0 - 1.2 mg/dL 0.5  0.5  0.7       Current Medications:   Current Outpatient Medications (Endocrine & Metabolic):     MOUNJARO 15 MG/0.5ML Pen, 15 mg once a week.  Current Outpatient Medications (Cardiovascular):    atorvastatin  (LIPITOR) 20 MG tablet, Take 20 mg by mouth at bedtime.   lisinopril  (ZESTRIL ) 5 MG tablet, Take 5 mg by mouth at bedtime.   Current Outpatient Medications (Analgesics):    aspirin  EC 81 MG tablet, Take 1 tablet (81 mg total) by mouth daily.   Current Outpatient Medications (Other):    clonazePAM (KLONOPIN) 0.5 MG disintegrating tablet, Take 0.5 mg by mouth 2 (two) times daily.   DULoxetine  (CYMBALTA ) 60 MG capsule, TAKE ONE CAPSULE BY MOUTH EVERY DAY   famotidine (PEPCID) 40 MG tablet, Take 1 tablet (40 mg total) by mouth at bedtime.   ondansetron  (ZOFRAN -ODT) 4 MG disintegrating tablet, Take 1 tablet (4 mg total) by mouth every 8 (eight) hours as needed for nausea or vomiting.   traZODone  (DESYREL ) 150 MG tablet, Take 1  tablet (150 mg total) by mouth at bedtime.   pantoprazole  (PROTONIX ) 40 MG tablet, Take 1 tablet (40 mg total) by mouth daily.  Medical History:  Past Medical History:  Diagnosis Date   Abnormal finding on MRI of brain 01/01/2020   Anxiety    Arthritis    Depression    Diabetes mellitus without complication (HCC)    GERD (gastroesophageal reflux disease)    History of kidney stones    hanging out in the kidney, no problems   Hypercholesteremia    Hyperlipidemia    Migraine    MS (multiple sclerosis) (HCC)    Stroke (HCC)    Thyromegaly 09/20/2013   TIA (transient ischemic attack) 2021   08/24/20   Uncontrolled type 2 diabetes mellitus with diabetic polyneuropathy, without long-term current use of insulin  07/10/2017   Allergies: Not on File   Surgical History:  She  has a past surgical history that includes Abdominal hysterectomy (2009); cholecystectomy (2000); Cesarean section (1987, 1990, 1999); Colonoscopy (11/14/2007); Wisdom tooth extraction; Upper gi endoscopy; Colonoscopy; and Anterior cervical decompression/discectomy fusion 4 level (N/A,  06/01/2023). Family History:  Her family history includes Diabetes in her paternal grandmother; Esophageal cancer in her father; Hypertension in her brother; Kidney cancer in her mother; Skin cancer in her mother.  REVIEW OF SYSTEMS  : All other systems reviewed and negative except where noted in the History of Present Illness.  PHYSICAL EXAM: BP 108/64   Pulse 80   Ht 5\' 2"  (1.575 m)   Wt 167 lb (75.8 kg)   SpO2 99%   BMI 30.54 kg/m  Physical Exam   GENERAL APPEARANCE: Well nourished, in no apparent distress. HEENT: No cervical lymphadenopathy, unremarkable thyroid , sclerae anicteric, conjunctiva pink. RESPIRATORY: Respiratory effort normal, breath sounds clear to auscultation bilaterally without rales, rhonchi, or wheezing. CARDIO: Regular rate and rhythm with no murmurs, rubs, or gallops, peripheral pulses intact. ABDOMEN: Abdomen soft, non-distended, active bowel sounds in all four quadrants, non-tender, no rebound, no mass appreciated. RECTAL: Declines. MUSCULOSKELETAL: Full range of motion, normal gait, without edema. SKIN: Dry, intact without rashes or lesions. No jaundice. NEURO: Alert, oriented, no focal deficits. PSYCH: Cooperative, normal mood and affect.      Edmonia Gottron, PA-C 3:27 PM

## 2024-03-05 NOTE — Patient Instructions (Addendum)
 You have been scheduled for a Barium Esophogram at [facility] (1st floor of the hospital) on 03/21/24 at 11:00 am. Please arrive 30 minutes prior to your appointment for registration.  If you need to reschedule for any reason, please contact radiology at [phone] to do so. __________________________________________________________________ A barium swallow is an examination that concentrates on views of the esophagus. This tends to be a double contrast exam (barium and two liquids which, when combined, create a gas to distend the wall of the oesophagus) or single contrast (non-ionic iodine based). The study is usually tailored to your symptoms so a good history is essential. Attention is paid during the study to the form, structure and configuration of the esophagus, looking for functional disorders (such as aspiration, dysphagia, achalasia, motility and reflux) EXAMINATION You may be asked to change into a gown, depending on the type of swallow being performed. A radiologist and radiographer will perform the procedure. The radiologist will advise you of the type of contrast selected for your procedure and direct you during the exam. You will be asked to stand, sit or lie in several different positions and to hold a small amount of fluid in your mouth before being asked to swallow while the imaging is performed .In some instances you may be asked to swallow barium coated marshmallows to assess the motility of a solid food bolus. The exam can be recorded as a digital or video fluoroscopy procedure. POST PROCEDURE It will take 1-2 days for the barium to pass through your system. To facilitate this, it is important, unless otherwise directed, to increase your fluids for the next 24-48hrs and to resume your normal diet.  This test typically takes about 30 minutes to perform. __________________________________________________________________________________   Dysphagia precautions:  1. Take  reflux medications 30+ minutes before food in the morning 2. Begin meals with warm beverage 3. Eat smaller more frequent meals 4. Eat slowly, taking small bites and sips 5. Alternate solids and liquids 6. Avoid foods/liquids that increase acid production 7. Sit upright during and for 30+ minutes after meals to facilitate esophageal clearing 8. All meats should be chopped finely.   If something gets hung in your esophagus and will not come up or go down, proceed to the emergency room.     Thank you for trusting me with your gastrointestinal care!   Santina Cull, PA-C   _______________________________________________________  If your blood pressure at your visit was 140/90 or greater, please contact your primary care physician to follow up on this.  _______________________________________________________  If you are age 50 or older, your body mass index should be between 23-30. Your Body mass index is 30.54 kg/m. If this is out of the aforementioned range listed, please consider follow up with your Primary Care Provider.  If you are age 75 or younger, your body mass index should be between 19-25. Your Body mass index is 30.54 kg/m. If this is out of the aformentioned range listed, please consider follow up with your Primary Care Provider.   ________________________________________________________  The [facility] providers would like to encourage you to use MYCHART to communicate with providers for non-urgent requests or questions.  Due to long hold times on the telephone, sending your provider a message by [facility] may be a faster and more efficient way to get a response.  Please allow 48 business hours for a response.  Please remember that this is for non-urgent requests.  _______________________________________________________

## 2024-03-21 ENCOUNTER — Ambulatory Visit (HOSPITAL_COMMUNITY)

## 2024-03-22 ENCOUNTER — Ambulatory Visit: Payer: Self-pay | Admitting: Physician Assistant

## 2024-03-22 ENCOUNTER — Ambulatory Visit (HOSPITAL_COMMUNITY)
Admission: RE | Admit: 2024-03-22 | Discharge: 2024-03-22 | Disposition: A | Discharge status: Home / Self Care | Source: Ambulatory Visit | Attending: Physician Assistant | Admitting: Physician Assistant

## 2024-03-22 DIAGNOSIS — R1319 Other dysphagia: Secondary | ICD-10-CM | POA: Diagnosis present

## 2024-05-30 ENCOUNTER — Other Ambulatory Visit: Payer: Self-pay | Admitting: Physician Assistant

## 2024-08-26 ENCOUNTER — Other Ambulatory Visit: Payer: Self-pay | Admitting: Physician Assistant

## 2024-09-19 ENCOUNTER — Encounter: Payer: Self-pay | Admitting: Gastroenterology

## 2024-10-04 ENCOUNTER — Other Ambulatory Visit: Payer: Self-pay | Admitting: Neurology

## 2024-10-18 ENCOUNTER — Ambulatory Visit

## 2024-10-18 VITALS — Ht 62.0 in | Wt 140.0 lb

## 2024-10-18 DIAGNOSIS — Z860100 Personal history of colon polyps, unspecified: Secondary | ICD-10-CM

## 2024-10-18 MED ORDER — NA SULFATE-K SULFATE-MG SULF 17.5-3.13-1.6 GM/177ML PO SOLN: 1.0000 | Freq: Once | ORAL | 0 refills | Status: AC

## 2024-10-18 NOTE — Progress Notes (Signed)
 Pre visit completed via video call; Patient verified name, DOB, and address; No egg or soy allergy known to patient;  No issues known to pt with past sedation with any surgeries or procedures; Patient denies ever being told they had issues or difficulty with intubation;  No FH of Malignant Hyperthermia; Pt is not on diet pills; Pt is not on home 02;  Pt is not on blood thinners;  Pt reports issues with constipation-patient reports she is increasing her fluids/takes her Mounjaro (helps move things), increasing activity, and increasing fruits/veggies;  No A fib or A flutter; Have any cardiac testing pending--NO Insurance verified during PV appt--- BCBS Pt can ambulate without assistance;  Pt denies use of chewing tobacco Discussed diabetic/weight loss medication holds; Discussed NSAID holds; Checked BMI to be less than 50; Pt instructed to use Single[employer].com or GoodRx for a price reduction on prep  Patient's chart reviewed by Norleen Schillings CNRA prior to previsit and patient appropriate for the [facility];  Pre visit completed and red dot placed by patient's name on their procedure day (on provider's schedule);   Instructions sent to MyChart per patient request;

## 2024-10-23 ENCOUNTER — Encounter: Admitting: Gastroenterology

## 2024-10-28 ENCOUNTER — Encounter: Payer: Self-pay | Admitting: Gastroenterology

## 2024-10-31 ENCOUNTER — Telehealth: Payer: Self-pay | Admitting: Gastroenterology

## 2024-10-31 NOTE — Telephone Encounter (Signed)
 Inbound call from patient wanting to know if a nurse can go over prep instructions with her for her upcoming procedure on 11/05/24 Requesting a call back  Please advise  Thank you

## 2024-11-01 NOTE — Telephone Encounter (Signed)
 Called pt back and reviewed instructions with pt, pt states she did not have the ability to get into mychart, resent pt text for mychart password reset and then reviewed instructions with pt so she could take notes. Pt verb understanding and let pt know to call if any further questions.

## 2024-11-05 ENCOUNTER — Encounter: Payer: Self-pay | Admitting: Gastroenterology

## 2024-11-05 ENCOUNTER — Ambulatory Visit: Admitting: Gastroenterology

## 2024-11-05 VITALS — BP 119/68 | HR 67 | Temp 97.5°F | Resp 16 | Ht 62.0 in | Wt 140.0 lb

## 2024-11-05 DIAGNOSIS — Z83719 Family history of colon polyps, unspecified: Secondary | ICD-10-CM

## 2024-11-05 DIAGNOSIS — K640 First degree hemorrhoids: Secondary | ICD-10-CM

## 2024-11-05 DIAGNOSIS — Z1211 Encounter for screening for malignant neoplasm of colon: Secondary | ICD-10-CM

## 2024-11-05 DIAGNOSIS — Z860100 Personal history of colon polyps, unspecified: Secondary | ICD-10-CM

## 2024-11-05 MED ORDER — SODIUM CHLORIDE 0.9 % IV SOLN: 500.0000 mL | Freq: Once | INTRAVENOUS | Status: DC

## 2024-11-05 NOTE — Progress Notes (Signed)
 [facility] History and Physical   Primary Care Physician:  Sabas Norleen PARAS., MD   Reason for Procedure:   H/O polyps  Plan:    colon   The patient was provided an opportunity to ask questions and all were answered. The patient agreed with the plan.   HPI: Joanne Reed is a 54 y.o. female    Past Medical History:  Diagnosis Date   Abnormal finding on MRI of brain 01/01/2020   Anxiety    Arthritis    Depression    Diabetes mellitus without complication (HCC)    GERD (gastroesophageal reflux disease)    History of kidney stones    hanging out in the kidney, no problems   Hypercholesteremia    Hyperlipidemia    Migraine    MS (multiple sclerosis) 2022   Thyromegaly 09/20/2013   TIA (transient ischemic attack) 2021   08/24/20   Uncontrolled type 2 diabetes mellitus with diabetic polyneuropathy, without long-term current use of insulin  07/10/2017    Past Surgical History:  Procedure Laterality Date   ABDOMINAL HYSTERECTOMY  2009   [facility]--Dr. Nikki   ANTERIOR CERVICAL DECOMPRESSION/DISCECTOMY FUSION 4 LEVELS N/A 06/01/2023   Procedure: ACDF R65,R54,R43,R32;  Surgeon: Carollee Lani BROCKS, DO;  Location: [facility] OR;  Service: Neurosurgery;  Laterality: N/A;  3C   CESAREAN SECTION  1987, 1990, 1999   x 3   cholecystectomy  2000   COLONOSCOPY  11/14/2007   Dr.Terrea Bruster   COLONOSCOPY  10/2021   suprep(good)-3 polyps   UPPER GI ENDOSCOPY     WISDOM TOOTH EXTRACTION      Prior to Admission medications  Medication Sig Start Date End Date Taking? Authorizing Provider  aspirin  EC 81 MG tablet Take 1 tablet (81 mg total) by mouth daily. 06/08/23  Yes Johnanna Credit Caylin, PA-C  atorvastatin  (LIPITOR) 20 MG tablet Take 20 mg by mouth at bedtime.   Yes [provider]  clonazePAM (KLONOPIN) 0.5 MG tablet Take 0.5 mg by mouth 2 (two) times daily. 08/21/24  Yes [provider]  DULoxetine  (CYMBALTA ) 60 MG capsule TAKE ONE CAPSULE BY MOUTH EVERY DAY Patient  taking differently: Take 60 mg by mouth daily. 07/19/22  Yes Onita Duos, MD  lisinopril  (ZESTRIL ) 5 MG tablet Take 5 mg by mouth at bedtime. 05/18/23  Yes [provider]  pantoprazole  (PROTONIX ) 40 MG tablet Take 1 tablet (40 mg total) by mouth daily. 03/05/24  Yes Craig Alan SAUNDERS, PA-C  traZODone  (DESYREL ) 150 MG tablet Take 1 tablet (150 mg total) by mouth at bedtime. 10/03/23  Yes Onita Duos, MD  methocarbamol  (ROBAXIN ) 500 MG tablet Take 1,000 mg by mouth 4 (four) times daily. 07/03/24   [provider]  MOUNJARO 15 MG/0.5ML Pen 15 mg once a week. 02/21/24   [provider]  ondansetron  (ZOFRAN -ODT) 4 MG disintegrating tablet Take 1 tablet (4 mg total) by mouth every 8 (eight) hours as needed for nausea or vomiting. 10/03/23   Onita Duos, MD    Current Outpatient Medications  Medication Sig Dispense Refill   aspirin  EC 81 MG tablet Take 1 tablet (81 mg total) by mouth daily.     atorvastatin  (LIPITOR) 20 MG tablet Take 20 mg by mouth at bedtime.     clonazePAM (KLONOPIN) 0.5 MG tablet Take 0.5 mg by mouth 2 (two) times daily.     DULoxetine  (CYMBALTA ) 60 MG capsule TAKE ONE CAPSULE BY MOUTH EVERY DAY (Patient taking differently: Take 60 mg by mouth daily.) 90 capsule  4   lisinopril  (ZESTRIL ) 5 MG tablet Take 5 mg by mouth at bedtime.     pantoprazole  (PROTONIX ) 40 MG tablet Take 1 tablet (40 mg total) by mouth daily. 90 tablet 3   traZODone  (DESYREL ) 150 MG tablet Take 1 tablet (150 mg total) by mouth at bedtime. 30 tablet 11   methocarbamol  (ROBAXIN ) 500 MG tablet Take 1,000 mg by mouth 4 (four) times daily.     MOUNJARO 15 MG/0.5ML Pen 15 mg once a week.     ondansetron  (ZOFRAN -ODT) 4 MG disintegrating tablet Take 1 tablet (4 mg total) by mouth every 8 (eight) hours as needed for nausea or vomiting. 20 tablet 6   Current Facility-Administered Medications  Medication Dose Route Frequency Provider Last Rate Last Admin   0.9 %  sodium chloride  infusion  500 mL  Intravenous Once Charlanne Groom, MD        Allergies as of 11/05/2024   (No Known Allergies)    Family History  Problem Relation Age of Onset   Colon polyps Mother 54   Kidney cancer Mother 75   Skin cancer Mother        unsure of type - not melanoma - place removed from nose.   Esophageal cancer Father 18       smoker/ETOH   Hypertension Brother    Diabetes Paternal Grandmother    Colon cancer Neg Hx    Rectal cancer Neg Hx    Stomach cancer Neg Hx     Social History   Socioeconomic History   Marital status: Married    Spouse name: Not on file   Number of children: 3   Years of education: college   Highest education level: Not on file  Occupational History   Occupation: Teacher  Tobacco Use   Smoking status: Former    Types: Cigarettes   Smokeless tobacco: Never   Tobacco comments:    01/01/20 - quit 25 years ago  Vaping Use   Vaping status: Never Used  Substance and Sexual Activity   Alcohol use: Not Currently    Comment: occasional   Drug use: Never   Sexual activity: Not Currently    Partners: Male    Birth control/protection: Surgical    Comment: Hysterectomy  Other Topics Concern   Not on file  Social History Narrative   Lives at home husband.   Right-handed.   Caffeine use: 20 oz of green tea daily.   Social Drivers of Health   Tobacco Use: Medium Risk (11/05/2024)   Patient History    Smoking Tobacco Use: Former    Smokeless Tobacco Use: Never    Passive Exposure: Not on file  Financial Resource Strain: Not on file  Food Insecurity: No Food Insecurity (10/22/2023)   Hunger Vital Sign    Worried About Running Out of Food in the Last Year: Never true    Ran Out of Food in the Last Year: Never true  Transportation Needs: No Transportation Needs (10/22/2023)   PRAPARE - Administrator, Civil Service (Medical): No    Lack of Transportation (Non-Medical): No  Physical Activity: Not on file  Stress: Not on file  Social Connections: Not on  file  Intimate Partner Violence: Not At Risk (10/22/2023)   Humiliation, Afraid, Rape, and Kick questionnaire    Fear of Current or Ex-Partner: No    Emotionally Abused: No    Physically Abused: No    Sexually Abused: No  Depression (PHQ2-9): Not on file  Alcohol Screen: Not on file  Housing: Low Risk (10/22/2023)   Housing Stability Vital Sign    Unable to Pay for Housing in the Last Year: No    Number of Times Moved in the Last Year: 0    Homeless in the Last Year: No  Utilities: Not At Risk (10/22/2023)   [employer] Utilities    Threatened with loss of utilities: No  Health Literacy: Not on file    Review of Systems: Positive for none All other review of systems negative except as mentioned in the HPI.  Physical Exam: Vital signs in last 24 hours: @VSRANGES @   General:   Alert,  Well-developed, well-nourished, pleasant and cooperative in NAD Lungs:  Clear throughout to auscultation.   Heart:  Regular rate and rhythm; no murmurs, clicks, rubs,  or gallops. Abdomen:  Soft, nontender and nondistended. Normal bowel sounds.   Neuro/Psych:  Alert and cooperative. Normal mood and affect. A and O x 3    No significant changes were identified.  The patient continues to be an appropriate candidate for the planned procedure and anesthesia.   Anselm Bring, MD. [facility] 11/05/2024 3:46 PM@

## 2024-11-05 NOTE — Progress Notes (Signed)
 Pt's states no medical or surgical changes since previsit or office visit.

## 2024-11-05 NOTE — Progress Notes (Signed)
 Transferred to PACU via stretcher. Patient arousing to stimulation.  VSS upon leaving procedure room.

## 2024-11-05 NOTE — Op Note (Signed)
 [facility] Patient Name: Joanne Reed Procedure Date: 11/05/2024 3:43 PM MRN: [id number] Endoscopist: Lynnie Bring , MD, [id number] Age: 54 Referring MD:  Date of Birth: 26-Sep-1971 Gender: Female Account #: [id number] Procedure:                Colonoscopy Indications:              High risk colon cancer surveillance: Personal                            history of colonic polyps. FH polyps (mom at age 31) Medicines:                Monitored Anesthesia Care Procedure:                Pre-Anesthesia Assessment:                           - Prior to the procedure, a History and Physical                            was performed, and patient medications and                            allergies were reviewed. The patient's tolerance of                            previous anesthesia was also reviewed. The risks                            and benefits of the procedure and the sedation                            options and risks were discussed with the patient.                            All questions were answered, and informed consent                            was obtained. Prior Anticoagulants: The patient has                            taken no anticoagulant or antiplatelet agents. ASA                            Grade Assessment: II - A patient with mild systemic                            disease. After reviewing the risks and benefits,                            the patient was deemed in satisfactory condition to                            undergo the procedure.  After obtaining informed consent, the colonoscope                            was passed under direct vision. Throughout the                            procedure, the patient's blood pressure, pulse, and                            oxygen saturations were monitored continuously. The                            Olympus Scope PCF SN [id number] was introduced through                            the anus  and advanced to the 2 cm into the ileum.                            The colonoscopy was performed without difficulty.                            The patient tolerated the procedure well. The                            quality of the bowel preparation was good. The                            terminal ileum, ileocecal valve, appendiceal                            orifice, and rectum were photographed. Scope In: 3:53:14 PM Scope Out: 4:11:39 PM Scope Withdrawal Time: 0 hours 12 minutes 30 seconds  Total Procedure Duration: 0 hours 18 minutes 25 seconds  Findings:                 The colon (entire examined portion) appeared normal.                           Non-bleeding internal hemorrhoids were found during                            retroflexion. The hemorrhoids were small and Grade                            I (internal hemorrhoids that do not prolapse).                           The terminal ileum appeared normal.                           Retroflexion in the right colon was performed.                           The exam was otherwise without abnormality on  direct and retroflexion views. Complications:            No immediate complications. Estimated Blood Loss:     Estimated blood loss: none. Impression:               - The entire examined colon is normal.                           - Non-bleeding internal hemorrhoids.                           - The examined portion of the ileum was normal.                           - The entire examined colon is normal on direct and                            retroflexion views.                           - The examination was otherwise normal on direct                            and retroflexion views.                           - No specimens collected. Recommendation:           - Patient has a contact number available for                            emergencies. The signs and symptoms of potential                             delayed complications were discussed with the                            patient. Return to normal activities tomorrow.                            Written discharge instructions were provided to the                            patient.                           - Resume previous diet.                           - Continue present medications.                           - Repeat colonoscopy in 5 years for screening                            purposes. Earlier, if with any new problems or  change in family history.                           - The findings and recommendations were discussed                            with the patient's family. Lynnie Bring, MD 11/05/2024 4:32:18 PM This report has been signed electronically.

## 2024-11-05 NOTE — Patient Instructions (Signed)
-  repeat colonoscopy in 5 years for surveillance recommended.    -Continue present medications   YOU HAD AN ENDOSCOPIC PROCEDURE TODAY AT THE [facility]:   Refer to the procedure report that was given to you for any specific questions about what was found during the examination.  If the procedure report does not answer your questions, please call your gastroenterologist to clarify.  If you requested that your care partner not be given the details of your procedure findings, then the procedure report has been included in a sealed envelope for you to review at your convenience later.  YOU SHOULD EXPECT: Some feelings of bloating in the abdomen. Passage of more gas than usual.  Walking can help get rid of the air that was put into your GI tract during the procedure and reduce the bloating. If you had a lower endoscopy (such as a colonoscopy or flexible sigmoidoscopy) you may notice spotting of blood in your stool or on the toilet paper. If you underwent a bowel prep for your procedure, you may not have a normal bowel movement for a few days.  Please Note:  You might notice some irritation and congestion in your nose or some drainage.  This is from the oxygen used during your procedure.  There is no need for concern and it should clear up in a day or so.  SYMPTOMS TO REPORT IMMEDIATELY:  Following lower endoscopy (colonoscopy or flexible sigmoidoscopy):  Excessive amounts of blood in the stool  Significant tenderness or worsening of abdominal pains  Swelling of the abdomen that is new, acute  Fever of 100F or higher   For urgent or emergent issues, a gastroenterologist can be reached at any hour by calling [phone]. Do not use MyChart messaging for urgent concerns.    DIET:  We do recommend a small meal at first, but then you may proceed to your regular diet.  Drink plenty of fluids but you should avoid alcoholic beverages for 24 hours.  ACTIVITY:  You should plan to take it  easy for the rest of today and you should NOT DRIVE or use heavy machinery until tomorrow (because of the sedation medicines used during the test).    FOLLOW UP: Our staff will call the number listed on your records the next business day following your procedure.  We will call around 7:15- 8:00 am to check on you and address any questions or concerns that you may have regarding the information given to you following your procedure. If we do not reach you, we will leave a message.     If any biopsies were taken you will be contacted by phone or by letter within the next 1-3 weeks.  Please call us at [phone] if you have not heard about the biopsies in 3 weeks.    SIGNATURES/CONFIDENTIALITY: You and/or your care partner have signed paperwork which will be entered into your electronic medical record.  These signatures attest to the fact that that the information above on your After Visit Summary has been reviewed and is understood.  Full responsibility of the confidentiality of this discharge information lies with you and/or your care-partner.

## 2024-11-06 ENCOUNTER — Telehealth: Payer: Self-pay

## 2024-11-06 NOTE — Telephone Encounter (Signed)
" °  Follow up Call-     11/05/2024    3:33 PM 03/09/2022   10:04 AM  Call back number  Post procedure Call Back phone  # [phone]  Permission to leave phone message Yes Yes     Patient questions:  Do you have a fever, pain , or abdominal swelling? No. Pain Score  0 *  Have you tolerated food without any problems? Yes.    Have you been able to return to your normal activities? Yes.    Do you have any questions about your discharge instructions: Diet   No. Medications  No. Follow up visit  No.  Do you have questions or concerns about your Care? No.  Actions: * If pain score is 4 or above: No action needed, pain <4.   "
